# Patient Record
Sex: Female | Born: 1935 | Race: White | Hispanic: No | Smoking: Never smoker
Health system: Southern US, Community
[De-identification: ages and names within clinical notes are randomized; demographics above are authoritative.]

## PROBLEM LIST (undated history)

## (undated) DIAGNOSIS — Z87898 Personal history of other specified conditions: Secondary | ICD-10-CM

## (undated) DIAGNOSIS — I10 Essential (primary) hypertension: Secondary | ICD-10-CM

## (undated) DIAGNOSIS — M545 Low back pain, unspecified: Secondary | ICD-10-CM

## (undated) DIAGNOSIS — I2510 Atherosclerotic heart disease of native coronary artery without angina pectoris: Secondary | ICD-10-CM

## (undated) DIAGNOSIS — I679 Cerebrovascular disease, unspecified: Secondary | ICD-10-CM

## (undated) DIAGNOSIS — Z9289 Personal history of other medical treatment: Secondary | ICD-10-CM

## (undated) DIAGNOSIS — G2581 Restless legs syndrome: Secondary | ICD-10-CM

## (undated) DIAGNOSIS — G8929 Other chronic pain: Secondary | ICD-10-CM

## (undated) DIAGNOSIS — E7800 Pure hypercholesterolemia, unspecified: Secondary | ICD-10-CM

## (undated) DIAGNOSIS — H8110 Benign paroxysmal vertigo, unspecified ear: Secondary | ICD-10-CM

## (undated) DIAGNOSIS — Z8739 Personal history of other diseases of the musculoskeletal system and connective tissue: Secondary | ICD-10-CM

## (undated) DIAGNOSIS — F329 Major depressive disorder, single episode, unspecified: Secondary | ICD-10-CM

## (undated) DIAGNOSIS — M1990 Unspecified osteoarthritis, unspecified site: Secondary | ICD-10-CM

## (undated) DIAGNOSIS — J309 Allergic rhinitis, unspecified: Secondary | ICD-10-CM

## (undated) DIAGNOSIS — M4800 Spinal stenosis, site unspecified: Secondary | ICD-10-CM

## (undated) DIAGNOSIS — F32A Depression, unspecified: Secondary | ICD-10-CM

## (undated) DIAGNOSIS — K219 Gastro-esophageal reflux disease without esophagitis: Secondary | ICD-10-CM

## (undated) DIAGNOSIS — G4700 Insomnia, unspecified: Secondary | ICD-10-CM

## (undated) DIAGNOSIS — H3530 Unspecified macular degeneration: Secondary | ICD-10-CM

## (undated) HISTORY — DX: Restless legs syndrome: G25.81

## (undated) HISTORY — DX: Personal history of other specified conditions: Z87.898

## (undated) HISTORY — DX: Benign paroxysmal vertigo, unspecified ear: H81.10

## (undated) HISTORY — DX: Major depressive disorder, single episode, unspecified: F32.9

## (undated) HISTORY — DX: Unspecified macular degeneration: H35.30

## (undated) HISTORY — DX: Allergic rhinitis, unspecified: J30.9

## (undated) HISTORY — DX: Cerebrovascular disease, unspecified: I67.9

## (undated) HISTORY — PX: APPENDECTOMY: SHX54

## (undated) HISTORY — DX: Spinal stenosis, site unspecified: M48.00

## (undated) HISTORY — DX: Insomnia, unspecified: G47.00

## (undated) HISTORY — DX: Personal history of other diseases of the musculoskeletal system and connective tissue: Z87.39

## (undated) HISTORY — DX: Low back pain: M54.5

## (undated) HISTORY — DX: Low back pain, unspecified: M54.50

## (undated) HISTORY — PX: CARDIAC SURGERY: SHX584

## (undated) HISTORY — DX: Personal history of other medical treatment: Z92.89

## (undated) HISTORY — DX: Gastro-esophageal reflux disease without esophagitis: K21.9

## (undated) HISTORY — DX: Atherosclerotic heart disease of native coronary artery without angina pectoris: I25.10

## (undated) HISTORY — DX: Depression, unspecified: F32.A

## (undated) HISTORY — DX: Pure hypercholesterolemia, unspecified: E78.00

## (undated) HISTORY — DX: Other chronic pain: G89.29

## (undated) HISTORY — DX: Essential (primary) hypertension: I10

## (undated) HISTORY — DX: Unspecified osteoarthritis, unspecified site: M19.90

## (undated) HISTORY — PX: BACK SURGERY: SHX140

## (undated) HISTORY — PX: CHOLECYSTECTOMY: SHX55

---

## 1998-05-11 ENCOUNTER — Ambulatory Visit (HOSPITAL_COMMUNITY): Admission: RE | Admit: 1998-05-11 | Discharge: 1998-05-11 | Payer: Self-pay | Admitting: Specialist

## 1998-08-08 ENCOUNTER — Ambulatory Visit (HOSPITAL_COMMUNITY): Admission: RE | Admit: 1998-08-08 | Discharge: 1998-08-08 | Payer: Self-pay | Admitting: Specialist

## 1999-05-29 ENCOUNTER — Ambulatory Visit (HOSPITAL_BASED_OUTPATIENT_CLINIC_OR_DEPARTMENT_OTHER): Admission: RE | Admit: 1999-05-29 | Discharge: 1999-05-29 | Payer: Self-pay | Admitting: Orthopedic Surgery

## 1999-11-13 ENCOUNTER — Encounter (INDEPENDENT_AMBULATORY_CARE_PROVIDER_SITE_OTHER): Payer: Self-pay | Admitting: Specialist

## 1999-11-13 ENCOUNTER — Ambulatory Visit (HOSPITAL_COMMUNITY): Admission: RE | Admit: 1999-11-13 | Discharge: 1999-11-13 | Payer: Self-pay | Admitting: Ophthalmology

## 1999-11-23 ENCOUNTER — Ambulatory Visit (HOSPITAL_COMMUNITY): Admission: RE | Admit: 1999-11-23 | Discharge: 1999-11-23 | Payer: Self-pay | Admitting: Ophthalmology

## 2000-02-16 ENCOUNTER — Encounter: Admission: RE | Admit: 2000-02-16 | Discharge: 2000-02-16 | Payer: Self-pay | Admitting: Internal Medicine

## 2000-02-16 ENCOUNTER — Encounter: Payer: Self-pay | Admitting: Internal Medicine

## 2000-05-14 ENCOUNTER — Inpatient Hospital Stay (HOSPITAL_COMMUNITY): Admission: EM | Admit: 2000-05-14 | Discharge: 2000-05-17 | Payer: Self-pay

## 2000-05-14 ENCOUNTER — Encounter: Payer: Self-pay | Admitting: General Surgery

## 2000-05-14 ENCOUNTER — Encounter: Payer: Self-pay | Admitting: Emergency Medicine

## 2000-10-08 HISTORY — PX: FOOT SURGERY: SHX648

## 2002-03-10 ENCOUNTER — Encounter: Admission: RE | Admit: 2002-03-10 | Discharge: 2002-03-10 | Payer: Self-pay | Admitting: Internal Medicine

## 2002-03-10 ENCOUNTER — Encounter: Payer: Self-pay | Admitting: Internal Medicine

## 2002-08-18 ENCOUNTER — Ambulatory Visit (HOSPITAL_COMMUNITY): Admission: RE | Admit: 2002-08-18 | Discharge: 2002-08-18 | Payer: Self-pay | Admitting: Cardiology

## 2002-10-08 DIAGNOSIS — Z9289 Personal history of other medical treatment: Secondary | ICD-10-CM

## 2002-10-08 HISTORY — DX: Personal history of other medical treatment: Z92.89

## 2002-12-28 ENCOUNTER — Encounter: Payer: Self-pay | Admitting: Internal Medicine

## 2002-12-28 ENCOUNTER — Encounter: Admission: RE | Admit: 2002-12-28 | Discharge: 2002-12-28 | Payer: Self-pay | Admitting: Internal Medicine

## 2003-01-11 ENCOUNTER — Encounter: Admission: RE | Admit: 2003-01-11 | Discharge: 2003-01-11 | Payer: Self-pay | Admitting: Orthopaedic Surgery

## 2003-01-11 ENCOUNTER — Encounter: Payer: Self-pay | Admitting: Orthopaedic Surgery

## 2003-01-12 ENCOUNTER — Ambulatory Visit (HOSPITAL_BASED_OUTPATIENT_CLINIC_OR_DEPARTMENT_OTHER): Admission: RE | Admit: 2003-01-12 | Discharge: 2003-01-12 | Payer: Self-pay | Admitting: Orthopaedic Surgery

## 2003-03-13 ENCOUNTER — Encounter: Payer: Self-pay | Admitting: Internal Medicine

## 2003-03-13 ENCOUNTER — Ambulatory Visit (HOSPITAL_COMMUNITY): Admission: RE | Admit: 2003-03-13 | Discharge: 2003-03-13 | Payer: Self-pay | Admitting: Internal Medicine

## 2003-04-07 ENCOUNTER — Encounter: Admission: RE | Admit: 2003-04-07 | Discharge: 2003-04-29 | Payer: Self-pay | Admitting: Orthopaedic Surgery

## 2003-10-09 HISTORY — PX: TOTAL KNEE ARTHROPLASTY: SHX125

## 2003-10-28 ENCOUNTER — Inpatient Hospital Stay (HOSPITAL_COMMUNITY): Admission: RE | Admit: 2003-10-28 | Discharge: 2003-11-06 | Payer: Self-pay | Admitting: Orthopaedic Surgery

## 2004-01-05 ENCOUNTER — Encounter: Admission: RE | Admit: 2004-01-05 | Discharge: 2004-01-05 | Payer: Self-pay | Admitting: Internal Medicine

## 2004-01-11 ENCOUNTER — Encounter (HOSPITAL_COMMUNITY): Admission: RE | Admit: 2004-01-11 | Discharge: 2004-04-10 | Payer: Self-pay | Admitting: Internal Medicine

## 2004-05-01 ENCOUNTER — Inpatient Hospital Stay (HOSPITAL_COMMUNITY): Admission: RE | Admit: 2004-05-01 | Discharge: 2004-05-02 | Payer: Self-pay | Admitting: Neurosurgery

## 2004-07-11 ENCOUNTER — Encounter: Admission: RE | Admit: 2004-07-11 | Discharge: 2004-07-11 | Payer: Self-pay | Admitting: Internal Medicine

## 2005-02-08 ENCOUNTER — Encounter: Admission: RE | Admit: 2005-02-08 | Discharge: 2005-02-08 | Payer: Self-pay | Admitting: Internal Medicine

## 2005-06-15 ENCOUNTER — Encounter (INDEPENDENT_AMBULATORY_CARE_PROVIDER_SITE_OTHER): Payer: Self-pay | Admitting: *Deleted

## 2005-06-15 ENCOUNTER — Inpatient Hospital Stay (HOSPITAL_COMMUNITY): Admission: EM | Admit: 2005-06-15 | Discharge: 2005-06-19 | Payer: Self-pay | Admitting: Emergency Medicine

## 2005-07-06 ENCOUNTER — Encounter: Admission: RE | Admit: 2005-07-06 | Discharge: 2005-07-06 | Payer: Self-pay | Admitting: Gastroenterology

## 2005-09-06 ENCOUNTER — Inpatient Hospital Stay (HOSPITAL_COMMUNITY): Admission: RE | Admit: 2005-09-06 | Discharge: 2005-09-10 | Payer: Self-pay | Admitting: Surgery

## 2005-09-06 ENCOUNTER — Encounter (INDEPENDENT_AMBULATORY_CARE_PROVIDER_SITE_OTHER): Payer: Self-pay | Admitting: Specialist

## 2006-02-11 ENCOUNTER — Encounter: Admission: RE | Admit: 2006-02-11 | Discharge: 2006-02-11 | Payer: Self-pay | Admitting: Internal Medicine

## 2006-03-07 ENCOUNTER — Encounter: Admission: RE | Admit: 2006-03-07 | Discharge: 2006-03-07 | Payer: Self-pay | Admitting: Internal Medicine

## 2006-08-07 ENCOUNTER — Emergency Department (HOSPITAL_COMMUNITY): Admission: EM | Admit: 2006-08-07 | Discharge: 2006-08-07 | Payer: Self-pay | Admitting: Emergency Medicine

## 2007-02-17 ENCOUNTER — Encounter: Admission: RE | Admit: 2007-02-17 | Discharge: 2007-02-17 | Payer: Self-pay | Admitting: Internal Medicine

## 2007-02-20 ENCOUNTER — Encounter: Admission: RE | Admit: 2007-02-20 | Discharge: 2007-02-20 | Payer: Self-pay | Admitting: Internal Medicine

## 2008-02-18 ENCOUNTER — Encounter: Admission: RE | Admit: 2008-02-18 | Discharge: 2008-02-18 | Payer: Self-pay | Admitting: Internal Medicine

## 2009-02-18 ENCOUNTER — Encounter: Admission: RE | Admit: 2009-02-18 | Discharge: 2009-02-18 | Payer: Self-pay | Admitting: Internal Medicine

## 2010-02-20 ENCOUNTER — Encounter
Admission: RE | Admit: 2010-02-20 | Discharge: 2010-02-20 | Payer: Self-pay | Source: Home / Self Care | Admitting: Internal Medicine

## 2010-10-01 ENCOUNTER — Emergency Department (HOSPITAL_BASED_OUTPATIENT_CLINIC_OR_DEPARTMENT_OTHER)
Admission: EM | Admit: 2010-10-01 | Discharge: 2010-10-02 | Disposition: A | Discharge status: Other Acute Inpatient Hospital | Payer: Self-pay | Source: Home / Self Care | Admitting: Emergency Medicine

## 2010-10-02 ENCOUNTER — Inpatient Hospital Stay (HOSPITAL_COMMUNITY)
Admission: EM | Admit: 2010-10-02 | Discharge: 2010-10-03 | Payer: Self-pay | Attending: Internal Medicine | Admitting: Internal Medicine

## 2010-10-29 ENCOUNTER — Encounter: Payer: Self-pay | Admitting: Internal Medicine

## 2010-12-18 LAB — BASIC METABOLIC PANEL
BUN: 20 mg/dL (ref 6–23)
BUN: 12 mg/dL (ref 6–23)
BUN: 16 mg/dL (ref 6–23)
CO2: 24 mEq/L (ref 19–32)
CO2: 20 mEq/L (ref 19–32)
CO2: 26 mEq/L (ref 19–32)
Calcium: 9.5 mg/dL (ref 8.4–10.5)
Calcium: 9.2 mg/dL (ref 8.4–10.5)
Chloride: 89 mEq/L — ABNORMAL LOW (ref 96–112)
Chloride: 91 mEq/L — ABNORMAL LOW (ref 96–112)
Chloride: 98 mEq/L (ref 96–112)
Creatinine, Ser: 1 mg/dL (ref 0.4–1.2)
Creatinine, Ser: 1.23 mg/dL — ABNORMAL HIGH (ref 0.4–1.2)
GFR calc Af Amer: >60 mL/min (ref >60)
GFR calc Af Amer: >60 mL/min (ref >60)
Glucose, Bld: 272 mg/dL — ABNORMAL HIGH (ref 70–99)
Potassium: 4.7 mEq/L (ref 3.5–5.1)

## 2010-12-18 LAB — DIFFERENTIAL
Basophils Absolute: 0 10*3/uL (ref 0.0–0.1)
Basophils Relative: 0 % (ref 0–1)
Basophils Relative: 0 % (ref 0–1)
Eosinophils Absolute: 0.3 10*3/uL (ref 0.0–0.7)
Eosinophils Absolute: 0 10*3/uL (ref 0.0–0.7)
Eosinophils Relative: 2 % (ref 0–5)
Eosinophils Relative: 0 % (ref 0–5)
Lymphs Abs: 0.4 10*3/uL — ABNORMAL LOW (ref 0.7–4.0)
Neutrophils Relative %: 83 % — ABNORMAL HIGH (ref 43–77)

## 2010-12-18 LAB — CBC
HCT: 31.2 % — ABNORMAL LOW (ref 36.0–46.0)
MCH: 30.3 pg (ref 26.0–34.0)
MCH: 31.5 pg (ref 26.0–34.0)
MCHC: 34.9 g/dL (ref 30.0–36.0)
MCV: 88.6 fL (ref 78.0–100.0)
MCV: 90.4 fL (ref 78.0–100.0)
MCV: 90.7 fL (ref 78.0–100.0)
Platelets: 247 10*3/uL (ref 150–400)
Platelets: 230 10*3/uL (ref 150–400)
RBC: 3.86 MIL/uL — ABNORMAL LOW (ref 3.87–5.11)
RBC: 3.33 MIL/uL — ABNORMAL LOW (ref 3.87–5.11)
RBC: 3.44 MIL/uL — ABNORMAL LOW (ref 3.87–5.11)
RDW: 12.5 % (ref 11.5–15.5)
RDW: 12.9 % (ref 11.5–15.5)
WBC: 14.7 10*3/uL — ABNORMAL HIGH (ref 4.0–10.5)

## 2010-12-18 LAB — MAGNESIUM: Magnesium: 1.6 mg/dL (ref 1.5–2.5)

## 2010-12-18 LAB — ABO/RH: ABO/RH(D): A NEG

## 2010-12-18 LAB — TYPE AND SCREEN: ABO/RH(D): A NEG

## 2010-12-18 LAB — HEMOGLOBIN AND HEMATOCRIT, BLOOD
HCT: 33.7 % — ABNORMAL LOW (ref 36.0–46.0)
Hemoglobin: 10.8 g/dL — ABNORMAL LOW (ref 12.0–15.0)
Hemoglobin: 11.4 g/dL — ABNORMAL LOW (ref 12.0–15.0)

## 2010-12-18 LAB — GLUCOSE, CAPILLARY
Glucose-Capillary: 126 mg/dL — ABNORMAL HIGH (ref 70–99)
Glucose-Capillary: 150 mg/dL — ABNORMAL HIGH (ref 70–99)
Glucose-Capillary: 157 mg/dL — ABNORMAL HIGH (ref 70–99)
Glucose-Capillary: 173 mg/dL — ABNORMAL HIGH (ref 70–99)

## 2010-12-18 LAB — POCT CARDIAC MARKERS: Myoglobin, poc: 95.5 ng/mL (ref 12–200)
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
## 2011-02-23 NOTE — Op Note (Signed)
[place]. [facility]  Patient:    Sheila [name]                         MRN: [id number] Proc. Date: 11/13/99 Adm. Date:  [id number] Attending:  Ernesto Rutherford                           Operative Report  PREOPERATIVE DIAGNOSIS: 1. Dislocated posterior chamber intraocular lens OD into the vitreous cavity -    silicone plate. 2. Aphakia.  OPERATION: 1. Posterior vitrectomy OD. 2. Removal of dislocated posterior chamber intraocular lens implanted material rom    the vitreous cavity OD. 3. Secondary posterior chamber intraocular lens implant OD with a Storz    posterior chamber intraocular lens No. [id number] Model EZE-60 power +26.0.  SURGEON:  Ernesto Rutherford, M.D.  ANESTHESIA:  Local retrobulbar with anesthesia control right eye.  INDICATIONS FOR PROCEDURE:  The patient is a 77 year old woman with aphakia on he basis of profound vision loss suddenly painlessly in the right eye secondary to  dislocation posterior chamber intraocular lens into the vitreous cavity.  This s an attempt to remove the intraocular lens to minimize retinal damage as well as to restore pseudoaphakic functioning vision with a sulcus fixated posterior chamber intraocular lens.  The patient understands the risks of anesthesia, including the rare occurrence of death,  but also to the eye, including hemorrhage, infection, scarring, need for further surgery, loss of vision, retinal detachment, no change in vision and need for another.  She gives her consent for anesthesia as well as surgery.  After appropriate signed consent was obtained, the patient was taken to the operating room.  In the operating room, the appropriate monitoring followed by ild sedation, 0.75% Marcaine delivered 5 cc retrobulbar, followed by an additional 5 cc laterally laterally in the fashion of modified Darel Hong.  The right periocular region was sterilely prepped and draped in the usual ophthalmic  fashion.  Lid speculum applied.  Conjunctiva was then incised superotemporally, inferotemporally, and superonasally.  A 4 mm infusion was secured 3.5 mm posterior to the limbus in the inferotemporal quadrant.  Placement in the vitreous cavity  verified visually.  Superior sclerotomies were then fashioned.  Wild microscope was placed in position with a Biom attachment.  Core vitrectomy was then successfully done using peripheral vitreous skirt, was then trimmed.  The intraocular lens overlying the posterior retina was identified and stood on edge by injected Provisc adjacent to the lens.  Crescent blade was then used to groove the limbal wound re. The anterior chamber was entered and deepened with Provisc followed by entering the entire length of the wound with MVR blade.  ____ forceps was then used to grasp  the intraocular lens, brought posterior to the iris plane and then with a second angled forceps through the limbal wound, the lens was grasped and removed without difficulty through the large capsular opening.  At this time, the chamber remained formed.  The infusion was turned on.  A Storz posterior chamber and lens was then entered into the sulcus and rotated into the 12 and 6 oclock meridian where the capsular support was excellent.  It was at the 9 oclock position that a posterior radial tear was noted, however, good intact anterior capsule was noted.  At this time, the limbal wound was closed with 10-0 nylon sutures in an interrupted fashion.  At this  time, the superior sclerotomies were then closed with 7-0 Vicryl sutures.  The infusion removed, similarly closed with 7-0 Vicryl suture. Conjunctiva closed with 7-0 Vicryl suture.  Subconjunctival injections of antibiotics and steroids applied.  Sterile patch and Fox shield applied to the right eye.  Intraocular pressure had been assessed and found to be adequate. Reinspection of the posterior cavity and posterior  chamber had been done and found to be normal and adequate.  Provisc had been aspirated from the posterior chamber. As said before, the superior sclerotomies were then closed with 7-0 Vicryl suture. The infusion was removed and similarly closed with 7-0 Vicryl suture. Subconjunctival injections were carried out.  The conjunctiva had been closed with 7-0 Vicryl sutures.  Steroid and antibiotic had been applied.  Sterile and Fox shield applied. The patient awakened from anesthesia and taken to the short-stay area to be discharged home as an outpatient after suffering no complications. DD:  11/13/99 TD:  11/13/99 Job: [id number]

## 2011-02-23 NOTE — Op Note (Signed)
   NAME:  Sheila [name], Sheila [name]                           ACCOUNT NO.:  [id number]   MEDICAL RECORD NO.:  [id number]                   PATIENT TYPE:  OIB   LOCATION:  NA                                   FACILITY:  [facility]   PHYSICIAN:  Francisca December, M.D.               DATE OF BIRTH:  January 27, 1925   DATE OF PROCEDURE:  08/18/2002  DATE OF DISCHARGE:                                 OPERATIVE REPORT   PROCEDURE:  Explant of loop recorder.   INDICATIONS:  The patient is [name] 77 year old woman who is now approximately  two years status post implantable loop recorder.  This was done for  unexplained syncope.  It has reached end of life and is to be removed at  this time.  The patient did not experience any further episodes of syncope.  The data was downloaded prior to removal.  It did show episodes of sinus  rhythm only, no significant bradycardia or tachycardia.   DESCRIPTION OF PROCEDURE:  The patient was brought to the cardiac  catheterization in the postabsorptive state.  The left prepectoral region  was prepped and draped in the usual sterile fashion.  Local anesthesia was  obtained with the infiltration of 1% lidocaine at the incision scar from the  previous implant.  [name] 2-3 cm incision was made, and this as carried down by  sharp dissection to the loop recorder capsule.  The capsule was incised.  The two 0 silk sutures were exposed, and these were excised.  The device was  delivered without difficulty.  The wound was then closed using 5-0 Dexon in  [name] running fashion for the subcuticular layer.  Steri-Strips and [name] sterile  dressing were applied.  The patient was transported to the recovery area in  stable condition.   The serial number of the loop recorder explanted was [id number], model  number [id number].                                               Francisca December, M.D.    JHE/MEDQ  D:  08/18/2002  T:  08/19/2002  Job:  [id number]   cc:   Lyn Records III, M.D.  [address]  [address]  [place]  [place] [address]  Fax: [phone]   Cardiac Catheterization Laboratory

## 2011-02-23 NOTE — Op Note (Signed)
Sheila [name], Sheila [name]                 ACCOUNT NO.:  [id number]   MEDICAL RECORD NO.:  [id number]          PATIENT TYPE:  OIB   LOCATION:  5731                         FACILITY:  [facility]   PHYSICIAN:  Currie Paris, M.D.DATE OF BIRTH:  03/06/1925   DATE OF PROCEDURE:  09/06/2005  DATE OF DISCHARGE:                                 OPERATIVE REPORT   OFFICE MEDICAL RECORD NUMBER:  [id number]   PREOPERATIVE DIAGNOSIS:  Chronic calculus cholecystitis.   POSTOPERATIVE DIAGNOSIS:  Chronic calculus cholecystitis.   OPERATION:  Laparoscopic cholecystectomy with operative cholangiogram.   SURGEON:  Currie Paris, M.D.   ASSISTANT:  Rose Phi. Maple Hudson, M.D.   ANESTHESIA:  General.   CLINICAL HISTORY:  This is a 77 year old lady with biliary-type symptoms and  a finding of some gallstones on ultrasound.  She elected to proceed to  laparoscopic cholecystectomy.   DESCRIPTION OF PROCEDURE:  The patient was seen  in the holding area and had  no further questions.  She confirmed the plans for the procedure.   She was taken to the operating room and after satisfactory general  endotracheal anesthesia had been obtained, the abdomen was prepped and  draped.  The time-out occurred.   0.25% plain Marcaine was used for the incisions.  The umbilical incision was  made, the fascia identified and the peritoneal cavity entered under direct  vision.  The pursestring was placed, the Hasson introduced and the abdomen  insufflated to 15.   A camera was inserted and everything appeared to be normal.  The 10/11  trocar was placed the epigastrium and two 5's laterally.   The gallbladder was retracted over the liver.  The perineum over the  triangle of Calot was opened and the cystic artery and cystic duct dissected  out and identified.  Both were clipped singly.  The cystic duct was then  opened and a Cook catheter threaded in and held with a clip.  Operative  cholangiography appeared normal.   The cystic duct catheter was removed and 3 clips placed on the stay side of  the cystic duct.  It was divided.  Two additional clips were placed on the  cystic artery and it was divided.  Another posterior branch of the cystic  artery was dissected out and clipped with 3 clips being left behind on the  stay side.   The gallbladder was then removed from below to above with the coagulation  current of the cautery.  Once it was disconnected, we made sure the bed was  dry and then it was brought out the umbilical port.  Final irrigation check  for hemostasis was made and everything still appeared to be dry.   The lateral ports were removed under direct vision.  There was no bleeding.  The umbilical port was closed with the pursestring.  The abdomen was  deflated through the epigastric port.  Skin was closed with 4-0 Monocryl  subcuticular plus Dermabond.   The patient tolerated the procedure well.  There were no operative  complications.  All counts were correct.  Currie Paris, M.D.  Electronically Signed     CJS/MEDQ  D:  09/06/2005  T:  09/07/2005  Job:  [id number]   cc:   Ladell Pier, M.D.  Fax: [phone]   Tasia Catchings, M.D.  Fax: [phone]

## 2011-02-23 NOTE — Op Note (Signed)
NAME:  Sheila [name], Sheila [name]                           ACCOUNT NO.:  [id number]   MEDICAL RECORD NO.:  [id number]                   PATIENT TYPE:  AMB   LOCATION:  DSC                                  FACILITY:  [facility]   PHYSICIAN:  Lubertha Basque. Jerl Santos, M.D.             DATE OF BIRTH:  10-Aug-1925   DATE OF PROCEDURE:  01/12/2003  DATE OF DISCHARGE:                                 OPERATIVE REPORT   PREOPERATIVE DIAGNOSES:  1. Left knee torn medial meniscus.  2. Left knee degenerative joint disease.   POSTOPERATIVE DIAGNOSES:  1. Left knee torn medial meniscus.  2. Left knee degenerative joint disease.   OPERATION PERFORMED:  1. Left knee partial medial meniscectomy.  2. Left knee chondroplasty patellofemoral and medial compartments.   SURGEON:  Lubertha Basque. Jerl Santos, M.D.   ASSISTANT:  Prince Rome, P.[name].   ANESTHESIA:  General.   INDICATIONS FOR PROCEDURE:  The patient is [name] 77 year old woman with [name] very  long history of left knee pain, swelling and catching.  She has undergone  preoperative testing including x-rays and an MRI scan which does show some  torn cartilage.  Her pain has persisted despite injections and activity  restriction.  She has been left with pain at rest and pain with activity and  is offered an arthroscopy.  The procedure was discussed with the patient and informed operative consent  was obtained after discussion of possible complications of reaction to  anesthesia and infection.   DESCRIPTION OF PROCEDURE:  The patient was taken to the operating suite  where general anesthetic was applied without difficulty.  The patient was  positioned supine and prepped and draped in the normal sterile fashion.  After administration of preop intravenous antibiotics, an arthroscopy of the  left knee was performed through two inferior portals.  The suprapatellar  pouch was benign while the patellofemoral joint exhibited grade 3 change  across the undersurface of the  patella.  [name] thorough chondroplasty was done.  Medial compartment exhibited grade 3 and grade 4 change in the medial  femoral condyle and degeneration of the middle and posterior horns of the  medial meniscus.  [name] 10% partial medial meniscectomy was performed.  The ACL  and the PCL were intact.  The lateral compartment was completely benign.  The knee was thoroughly irrigated at the end of the case followed by  placement of Marcaine with epinephrine and morphine, plus Depo-Medrol.  Adaptic was placed over the portals followed by dry gauze and [name] loose Ace  wrap.  Estimated blood loss and intraoperative fluids as well as accurate  tourniquet time can be obtained from anesthesia records.    DISPOSITION:  The patient was extubated in the operating room and taken to  the recovery room in stable condition.  Plans were for the patient  to go  home the same day and to follow up in the office  in less than [name] week.  I  will contact her by phone tonight.                                                 Lubertha Basque Jerl Santos, M.D.    PGD/MEDQ  D:  01/12/2003  T:  01/13/2003  Job:  [id number]

## 2011-02-23 NOTE — Discharge Summary (Signed)
NAME:  Sheila [name], [name]                           ACCOUNT NO.:  [id number]   MEDICAL RECORD NO.:  [id number]                   PATIENT TYPE:  INP   LOCATION:  5023                                 FACILITY:  [facility]   PHYSICIAN:  Lubertha Basque. Jerl Santos, M.D.             DATE OF BIRTH:  08/26/1925   DATE OF ADMISSION:  10/28/2003  DATE OF DISCHARGE:  11/06/2003                                 DISCHARGE SUMMARY   ADMISSION DIAGNOSES:  1. End-stage degenerative joint disease, left knee.  2. Status post coronary artery bypass graft.  3. Hypertension.   DISCHARGE DIAGNOSES:  1. End-stage degenerative joint disease, left knee.  2. Status post coronary artery bypass graft.  3. Hypertension.   OPERATIONS:  Left total knee replacement.   BRIEF HISTORY:  Ms. Sheila [name] is a 77 year old white female patient, well known  to our practice, who is now having left knee pain with every step, trouble  sleeping at nighttime and trouble ambulating.  She, on x-ray, has end-stage  bone on bone degenerative joint disease of her left knee.  We had done an  arthroscopy of her left knee in 2004 and noted these degenerative changes  and she never really recuperated well, always having difficulty and we  discussed treatment options with the patient and family and decided to  proceed with total knee replacement.   PERTINENT LABORATORY AND X-RAY FINDINGS:  There was a KUB of the abdomen  taken during the hospitalization because of consultation and this revealed  on last testing hemoglobin was 10.6.  She had a drop in her hemoglobin  during her hospital stay and blood was replaced as necessary.  Hematocrit of  30.0, INR 2.4 last testing.  Other blood indices:  INR was 2.6 last testing.  Sodium 132, potassium 3.9, chloride 94, glucose 116, noted to be AA negative  blood type.   HOSPITAL COURSE:  The patient was admitted postoperatively, placed on p.o.  and IM analgesics for pain.  Her postoperative orders included  physical  therapy, to be weightbearing as tolerated, placed back on her home  medications, IV Ancef a gram q.8.h. x3 doses, PCA morphine pump was also  used and pharmacy for Lovenox and Coumadin DVT protocol.  Consultation from  the rehabilitation unit was also done.  She progressed well with therapy.  Because of her preoperative medicines, one of which was Plavix, there was  some ecchymoses around the incision and at the area of the tourniquet.  At  no time, did it appear to be infected and this was kept a close watch on and  was resolving at the time of her discharge from the hospital.  CPM was used  as well to help gain motion.  In addition, the physical therapy.  She was  eating and voiding.  Dressing was changed numerous times and wound kept a  close eye on, this to be sure of any  changes, none of which at any time  during her hospital stay appeared to be an infection.  At one point in her  hospitalization, she was offered a bed at rehabilitation and she had  declined it and was discharged home.   DISCHARGE CONDITION:  Improved.   DISCHARGE MEDICATIONS:  1. Coumadin per pharmacy protocol, 30 days.  2. Vicodin for pain.  3. Dyazide 37.5/25 one a day.  4. Toprol XL 50, one a day.  5. Klonopin 1 mg 1 q.h.s.  6. Norvasc 2.5 one daily.  7. Avapro 300 one daily.  8. Zocor 40 one daily.  9. Prozac 20 one daily.  10.      Skelaxin 400 1 p.o. q.4-6 p.r.n. pain or spasm.   DISCHARGE INSTRUCTIONS:  [facility] was ordered for home visits by  the physical therapist and nurse.  A CPM machine would also be used and pro  times drawn as she was on low dose Coumadin protocol for deep vein  thrombosis prophylaxis.  She could be weightbearing as tolerated.  Dressing  to be changed every other day.  She will return to our office in one week  and the phone number is given to her, [phone].   DISCHARGE DIET:  Unrestricted.      Lindwood Qua, P.A.                    Lubertha Basque  Jerl Santos, M.D.    MC/MEDQ  D:  11/25/2003  T:  11/26/2003  Job:  [id number]

## 2011-02-23 NOTE — Op Note (Signed)
NAME:  WRIGLEY, WINBORNE A                           ACCOUNT NO.:  [id number]   MEDICAL RECORD NO.:  [id number]                   PATIENT TYPE:  INP   LOCATION:  2899                                 FACILITY:  [facility]   PHYSICIAN:  Donalee Citrin, M.D.                     DATE OF BIRTH:  1925/01/09   DATE OF PROCEDURE:  05/01/2004  DATE OF DISCHARGE:                                 OPERATIVE REPORT   PREOPERATIVE DIAGNOSIS:  Severe spinal stenosis from facet arthropathy and a  large ruptured disk at L1-2 with left-sided L2 radiculopathy.   POSTOPERATIVE DIAGNOSIS:  Severe spinal stenosis from facet arthropathy and  a large ruptured disk at L1-2 with left-sided L2 radiculopathy.   OPERATION PERFORMED:  Decompressive laminectomy, L1-2; microscopic  diskectomy, L1-2; microscopic decompression of the left L2 nerve root.   SURGEON:  Donalee Citrin, M.D.   ASSISTANT:  Kathaleen Maser. Pool, M.D.   ANESTHESIA:  General.   INDICATIONS FOR PROCEDURE:  The patient is a very pleasant 77 year old  female who has had longstanding back and left leg pain radiating through her  hip around the groin to the front of her thigh with weakness in her  iliopsoas.  Preoperative imaging showed severe spinal stenosis with thecal  sac compression and left L1 and L2 nerve compression from a large ruptured  disk fragment.  The patient has failed all forms of conservative treatment  and had progressively worsening pain and left back and leg pain.  The  patient was recommended decompression procedure.  I extensively went over  the risks and benefits of surgery for decompressive laminectomy and  diskectomy with her.  She understands and agrees to proceed forward.   DESCRIPTION OF PROCEDURE:  The patient was brought to the operating room and  induced under general anesthesia, placed prone on the Wilson frame.  Back  was prepped in the usual fashion.  Preoperative imaging localized the L1-2  disk space.  A midline incision was made and  Bovie electrocautery was used  to take down this incision and subperiosteal dissection was carried out to  the lamina of L1 and L2 on the left side.  Then using high speed drill, the  inferior aspect of the lamina of L1 and the medial osteophyte complex was  drilled down after localization film confirmed the L1-2 disk space.  Then  using a 3 and 4 mm Kerrison punch, the remainder of the disk was drilled  down and bitten off exposing a severely hypertrophied ligament with  hourglass compression of the thecal sac.  At this point the operating  microscope was draped and brought into the field.  Under microscopic  illumination, some additional under-bites were taken from the undersurface  of the spinous process of L2 as well as the superior aspect of the L2  lamina.  Virtually all the lamina was removed and __________ was used  to  dissect hypertrophied ligament off of the thecal sac.  This was underbitten  with 2 and 3 mm Kerrison punches and then __________ was used to gently  dissect the L2 nerve off the large ruptured disk still contained in the  ligament in the lateral aspect of the interspace. This was incised with 11  blade scalpel.  This using a blunt nerve hook, several large fragments of  disk were removed from the lateral aspect of the disk space and in the  foramen.  These were all decompressed and removed with pituitary rongeurs  and blunt nerve hook.  At the end of diskectomy, there was no further  stenosis of the L1 and L2 nerve roots.  Several large fragments had been  removed from the lateral compartment as well as the medial compartment.  A  hockey stick and a coronary dilator were used to explore the thecal sac, L1  and L2 neural foramina.  All were noted to be widely patent.  The wound was  then copiously irrigated.  Meticulous hemostasis was obtained.  Gelfoam was  laid overtop the dura.  The muscle and fascia reapproximated with 0 and  interrupted Vicryl.  Then the skin was  closed with running 4-0 subcuticular  after subcutaneous tissue closed with 2-0 interrupted Vicryl and benzoin and  Steri-Strips applied.  The patient was taken to the recovery room in stable  condition.  At the end of the case all sponge, needle and instrument counts  were correct.                                               Donalee Citrin, M.D.    GC/MEDQ  D:  05/01/2004  T:  05/01/2004  Job:  [id number]
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
## 2011-02-23 NOTE — Discharge Summary (Signed)
Sheila [name], Sheila [name]                 ACCOUNT NO.:  [id number]   MEDICAL RECORD NO.:  [id number]          PATIENT TYPE:  INP   LOCATION:  4734                         FACILITY:  [facility]   PHYSICIAN:  Ladell Pier, M.D.   DATE OF BIRTH:  11/17/1924   DATE OF ADMISSION:  06/14/2005  DATE OF DISCHARGE:  06/19/2005                                 DISCHARGE SUMMARY   DISCHARGE DIAGNOSES:  1.  Right upper quadrant pain.  Abdominal ultrasound positive for      gallstones.  HIDA scan normal.  2.  Fever/bronchitis.  3.  Leukocytosis, resolved.  4.  Chronic neck pain.  5.  Chronic back pain.  She wears a TENS unit.  She sees chiropractor, Dr.      __________.  6.  Hypertension.  7.  Dyslipidemia.  8.  Carotid stenosis on Doppler ultrasound, 2001, no visit per records.      Negative MRA, 2005.  Carotid coronary arteries.  9.  Coronary artery disease, status post CABG.  Cardiolite in 2004 was      negative.  EF 68%.  Cardiologist:  Dr. Katrinka Blazing.  A 2-D echo in the      hospital this admission was normal.  Overall left ventricular systolic      function was in the lower limits of normal and his EF was 50-55%.      Cardiac enzymes negative.  10. Osteoarthritis.  11. Depression.  12. Restless leg syndrome.  13. History of chronic knee pain.  14. History of syncope with negative syncopal workup.  15. Allergic rhinitis.  16. Complex renal cyst.  She is followed by nephrology.   DISCHARGE MEDICATIONS:  1.  Lipitor 40 mg at bedtime.  2.  Micardis 80 mg daily.  3.  Aspirin 81 mg daily.  4.  Plavix 75 mg daily.  5.  Prozac 20 mg daily.  6.  Zantac 75 mg daily.  7.  Clonazepam 1 mg at bedtime.  8.  Toprol XL 50 mg daily.  9.  Triamterene/HCTZ 37.5/25 mg daily.  10. Norvasc 5 mg daily.  11. Zyrtec 10 mg daily.  12. Nexium 40 mg daily.  13. Nasacort AQ daily.   CONSULTATIONS:  None.   PROCEDURES:  None.   FOLLOW UP:  The patient is to follow up at Dr. Olena Leatherwood in one week.  The  patient was  referred to [facility].  The family would like patient to  be evaluated for cholecystectomy.   HISTORY OF PRESENT ILLNESS:  The patient is a 77 year old white female that  presented to the office with nausea, abdominal bloating, vomiting, and  question about abdominal bloating that has been going on for the past few  days.  She had a urinary tract infection.  She had presented to her  nephrologist and was prescribed nitrofurantoin for possible urinary tract  infection, and feeling poorly.  She has been coughing up yellow mucus for 1-  2 weeks.  Her urinary symptoms have resolved with the nitrofurantoin, but  she has been taking the Zyrtec with still having the cough.  She  feels some  shortness of breath.   Past medical history, family history, social history, medications,  allergies, review of systems, per admission H/P.   PHYSICAL EXAMINATION:  VITAL SIGNS:  Temperature 98.5 pulse 60, respirations  16, blood pressure 124/70, pulse oximetry 96% on room air.  HEENT:  Head is normocephalic, atraumatic.  Pupils equal, round, and  reactive to light but without erythema.  CARDIOVASCULAR:  Regular rate and rhythm.  LUNGS:  Clear bilaterally.  No wheezes, rhonchi, are real.  ABDOMEN:  Soft, nontender, nondistended.  Positive bowel sounds.  EXTREMITIES:  No edema.   HOSPITAL COURSE:  1.  NAUSEA, VOMITING, ABDOMINAL PAIN.  She was admitted.  She had abdominal      x-ray that was normal.  Chest x-ray normal.  She had a 2-D echo that was      normal.  Abdominal ultrasound negative.  Gallbladder ultrasound normal.      She was placed on Levaquin.  Her nausea and vomiting resolved, then she      developed pain in the right upper quadrant going towards her back.      Discussed with family and will refer the patient for possible      cholecystectomy, outpatient with general surgery.  2.  BRONCHITIS.  She was treated with IV Levaquin and her respiratory      symptoms improved.  She however  still does have a cough.  Will continue      her on the Zyrtec and the Nasacort AQ.  3.  GERD:  Continue her on the proton  pump inhibitor.  4.  HYPERTENSION.  Her blood pressure remained stable throughout the      hospitalization.  5.  HYPONATREMIA.  Most likely secondary to the thiazide diuretic with her      nausea and vomiting.  I will continue her on diuretics on discharge and      she will follow up in the office and will recheck her BNP at that time.   LABORATORY DATA:  BNP 358, BNP sodium 138, potassium 3.8, chloride 104, CO2  28, glucose 102, BUN 5, creatinine 0.8.  CEC 9.4, hemoglobin 10.3, platelets  264.  Cardiac enzymes negative.  Blood cultures negative x2.  LDH 173.  HIDA  scan was normal.   Chest x-ray:  Chronic interstitial changes.  A 2-D echo:  Overall left  ventricular systolic function was lower limits of normal.  EF of 50-55%.  Abdominal ultrasound:  Cholelithiasis without evidence of cholecystitis, 1.8  cm on right complex renal cyst.  We did add to the discharge diagnosis,  complex renal cyst.     Ladell Pier, M.D.  Electronically Signed    NJ/MEDQ  D:  06/19/2005  T:  06/19/2005  Job:  [id number]

## 2011-02-23 NOTE — H&P (Signed)
NAME:  Sheila [name], Sheila [name]                 ACCOUNT NO.:  [id number]   MEDICAL RECORD NO.:  [id number]          PATIENT TYPE:  INP   LOCATION:  4734                         FACILITY:  [facility]   PHYSICIAN:  Ladell Pier, M.D.   DATE OF BIRTH:  11/18/1924   DATE OF ADMISSION:  06/14/2005  DATE OF DISCHARGE:                                HISTORY & PHYSICAL   The patient is a 77 year old white female that had presented to the office  with nausea and vomiting, a question about abdominal bloating that has been  going on for the past few days.  First, she had a urinary tract infection,  presented to her nephrologist, was given a prescription for nitrofurantoin  that she took for possible urinary tract infection.  Since then, she has  been feeling poorly.  She has been coughing up yellow mucus x1 to 2 weeks.  Her urinary symptoms have resolved.  She has been taking Zyrtec but  continues to cough.  She feels some shortness of breath.   PAST MEDICAL HISTORY:  1.  Significant for chronic neck pain.  2.  Chronic back pain and she wears a TENS unit for.  She sees a      Land and she also sees Dr. Wynetta Emery in the past.  She has had lower      back surgery.  3.  Hypertension.  4.  Dyslipidemia.  5.  Carotid stenosis on Doppler ultrasound in 2001, 40-50% stenosis per      records.  Negative MRA in 2005.  6.  Coronary artery disease status post coronary artery bypass graft.      Cardiolite in 2004 was negative.  Ejection fraction of 86%.      Cardiologist is Dr. Katrinka Blazing.  7.  Osteoarthritis with degenerative changes in her fingers, distal      interphalangeal joint.  8.  Depression.  9.  Restless leg syndrome.  10. History of chronic knee pain.  11. History of syncope with negative syncopal work up.  12. Allergic rhinitis.   FAMILY HISTORY:  Father died at 25 from prostate cancer and old age.  Mother  died at 43.  She had Alzheimer's disease and pancreatic cancer.   SOCIAL HISTORY:  Her husband  just died in 2005/02/03.  She does not smoke  or drink.  Her husband had severe end-stage congestive heart failure.  He  died of a stroke.   MEDICATIONS:  1.  She takes Lipitor 40 mg at bedtime.  2.  Micardis 80 mg daily.  3.  Aspirin 81 mg daily.  4.  Plavix 75 mg daily.  5.  Prozac 20 mg daily.  6.  Zantac 75 mg daily.  7.  Clonazepam 1 mg at bedtime.  8.  Toprol XL 50 mg daily.  9.  Triamterine/HCTZ 37.5/25 mg daily.  10. Norvasc 5 mg daily.  11. Zyrtec 10 mg daily.  12. Nexium 40 mg daily.  13. Nasacort AQ daily.   ALLERGIES:  NOVOCAIN AND CONTRAST DYE, ALEVE AND CELEBREX.   REVIEW OF SYSTEMS:  As per history of  present illness.   PHYSICAL EXAMINATION:  VITAL SIGNS:  Blood pressure 120/60, weight 125,  heart rate 82, temperature in the office is 98.5.  Temperature in the  hospital 102, pulse oximetry 95% on room air.  HEENT:  Normocephalic, atraumatic.  Pupils equal, round, reactive to light.  Throat without erythema.  CARDIOVASCULAR:  A regular rate and rhythm with a 2/6 systolic murmur.  LUNGS:  Clear bilaterally.  No wheezes, rhonchi or rales.  ABDOMEN:  Soft, nontender, nondistended.  Positive bowel sounds.  EXTREMITIES:  Without edema.  NEUROLOGICAL:  Cranial nerves II-XII intact.  Strength 5/5 throughout.   LABORATORY DATA:  Urinalysis negative.  Cardiac enzymes negative.  EKG  without any significant changes.  Lipase 36, glucose 110, BUN 11, creatinine  0.8, sodium 130, potassium 3.9, chloride 90, CO2 29, calcium 9.9. BMP 262,  hemoglobin 13.2, MCV 91.   ASSESSMENT AND PLAN:  1.  Nausea and vomiting, fever, abdominal discomfort.  Will admit.  The      patient to the hospital for these symptoms for the second time.  They      were in the office, sent home and now came to the emergency room.  The      patient lives by herself.  We will admit her to the hospital, get an      abdominal ultrasound, rule out gallbladder disease and give her IV      Levaquin.  2.   Gastroesophageal reflux disease.  Continue her on her proton pump      inhibitor.  3.  Hypertension.  Will continue her home blood pressure medication except      for the triamterine/HCTZ.  4.  Hyponatremia.  Most likely secondary to blood pressure medications and      monitor.  5.  Hypokalemia.  Most likely secondary to blood pressure medications and      monitor.  Will replace potassium and monitor her BMP.      Ladell Pier, M.D.  Electronically Signed     NJ/MEDQ  D:  06/15/2005  T:  06/15/2005  Job:  [id number]
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
## 2011-02-23 NOTE — Discharge Summary (Signed)
[place]  Patient:    Sheila [name], Sheila [name]                        MRN: [id number] Adm. Date:  [id number] Disc. Date: [id number] Attending:  Trauma, Md Dictator:   Eugenia Pancoast, P.A. CC:         Elana Alm. Thurston Hole, M.D. (Please include original Summary for this patient for this admit.)             Darci Needle, M.D.  Nathen May, M.D., [facility]  Francisca December, M.D.   Discharge Summary  ADDENDUM  DATE OF BIRTH:  1925-08-03  The patient was seen by cardiology in followup during her stay, and it was decided the patient should undergo an inplantable loop recorder for long-term evaluation of her heart rhythm to rule out any arrhythmias which may be causing her syncopal episodes.  This procedure was performed by Dr. Amil Amen on May 16, 2000.  The patient tolerated the procedure well, and no intraoperative complications occurred.  Postoperatively, the patient did quite well.  On the following morning, she was doing well.  She was up and eating breakfast in the morning and tolerating diet satisfactorily.  The incision looked clean and dry.  No sign of infection was noted.  Because of this, she was subsequently prepared for discharge.  On further discussion with the patient, the patient has a left leg immobilizer secondary to her left patellar fracture.  She is advised to follow up with her regular orthopedic surgeon, Dr. Thurston Hole, within the next week for recheck of her knee.  The patient otherwise is doing well.  She was subsequently discharged to home in satisfactory and stable condition. DD:  05/17/00 TD:  05/18/00 Job: [id number]

## 2011-02-23 NOTE — Op Note (Signed)
NAME:  Sheila [name], Sheila [name]                           ACCOUNT NO.:  [id number]   MEDICAL RECORD NO.:  [id number]                   PATIENT TYPE:  INP   LOCATION:  2899                                 FACILITY:  [facility]   PHYSICIAN:  Lubertha Basque. Jerl Santos, M.D.             DATE OF BIRTH:  1925-09-07   DATE OF PROCEDURE:  10/28/2003  DATE OF DISCHARGE:                                 OPERATIVE REPORT   PREOPERATIVE DIAGNOSIS:  Left knee degenerative arthritis.   POSTOPERATIVE DIAGNOSIS:  Left knee degenerative arthritis.   PROCEDURE:  Left total knee replacement.   ANESTHESIA:  General and femoral nerve block.   SURGEON:  Lubertha Basque. Jerl Santos, M.D.   ASSISTANT:  Lindwood Qua, P.[name].   INDICATIONS FOR PROCEDURE:  The patient is [name] 77 year old woman with [name] long  history of left knee pain.  This has persisted despite oral anti-  inflammatories then injections as well as an arthroscopy at which point end  stage degenerative changes were seen.  She has persisted with night pain and  activity pain and is offered [name] knee replacement operation.  Informed  operative consent was obtained after discussing the possible complications  of reaction to anesthesia, infection, DVT, PE, and death.   DESCRIPTION OF PROCEDURE:  The patient was taken to the operating room suite  where general anesthetic was applied without difficulty.  She was also given  [name] femoral nerve block in the preanesthesia area.  She was positioned supine  and prepped and draped in normal sterile fashion.  After administration of  preop IV antibiotics, the left leg was elevated, exsanguinated, and [name]  tourniquet inflated about the thigh.  [name] longitudinal anterior incision was  made with dissection down to the extensor mechanism.  All appropriate anti-  infective measures were used including closed hooded exhaust systems for  each member of the surgical team, Betadine impregnated drape, and the  aforementioned IV antibiotic.  [name] medial  parapatellar incision was made.  The  knee cap was lifted and the knee flexed.  She had severe end stage  degenerative change medial and lateral.  The ACL and PCL were intact and  were removed.  Her bone quality was fair.  I removed some residual meniscal  tissues.  An extramedullary guide was placed on the tibia to create [name] cut  with [name] slight posterior tilt on the tibia.  The extramedullary guide was  then placed on the femur and utilizing the anterior and posterior cuts  creating [name] flexion gap of 10 mm.  An intramedullary guide was placed into  the femur and utilized to make [name] distal cut creating an equal extension gap  of 10 mm balancing the knee.  The femur sized to [name] size standard  compartment.  An appropriate guide was placed and utilized.  The tibia sized  to [name] size 3 component, the appropriate guide was placed and  utilized.  The  patella was cut down from 20 to 15 mm of thickness and sized to [name] 32 mm  component and the appropriate guide was placed and utilized.  The trial  reduction was done with all the components.  The knee easily came to full  extension.  The knee cap tracked well.  The trial components were removed.  Pulsatile lavage was used to cleanse all cut bony surfaces.  The cement was  mixed including the antibiotics and we then pressurized all bony surfaces.  The aforementioned DePuy LCS components were placed which were 3 tibia,  standard femur, 32 polyethylene patella, and 10 mm deep dish spacer.  Excess  cement was trimmed and pressure was held on components until the cement had  hardened.  The tourniquet was deflated and the small amount of bleeding was  easily controlled with Bovie cautery.  The decision was made not to place [name]  drain.  The extensor mechanism was reapproximated with #1 Vicryl in an  interrupted fashion followed by subcutaneous tissue reapproximation with 0  and 2-0 Vicryl in an interrupted fashion, followed by skin closure with  staples.  The knee  went from full extension to 130 degrees of flexion at the  end of the case.  Adaptic was applied to the wound followed by dry gauze and  [name] loose Ace wrap.  Estimated blood loss and fluids can be obtained from  anesthesia records as can an accurate tourniquet time.   DISPOSITION:  The patient was extubated in the operating room and taken to  the recovery room in stable condition.  The plans were for her to be  admitted to the orthopedic surgery service with appropriate postop care to  include perioperative antibiotics and Coumadin/Lovenox for DVT prophylaxis.                                               Lubertha Basque Jerl Santos, M.D.    PGD/MEDQ  D:  10/28/2003  T:  10/28/2003  Job:  [id number]

## 2011-02-23 NOTE — Discharge Summary (Signed)
Sheila [name], HOUSEMAN                 ACCOUNT NO.:  [id number]   MEDICAL RECORD NO.:  [id number]          PATIENT TYPE:  INP   LOCATION:  5731                         FACILITY:  [facility]   PHYSICIAN:  Currie Paris, M.D.DATE OF BIRTH:  1925/08/21   DATE OF ADMISSION:  09/06/2005  DATE OF DISCHARGE:  09/10/2005                                 DISCHARGE SUMMARY   DIAGNOSES:  1.  Chronic calculus cholecystitis.  2.  Postoperative bleed into abdominal wall.  3.  History of hypertension.  4.  History of reflux.   CLINICAL HISTORY:  Ms. Peaden is a 77 year old lady admitted electively for  cholecystectomy.  She has been having intermittent right upper quadrant pain  and was noted to have gallstones.  She had a fairly severe episode in  September, at which point she also had a urinary tract infection.  She was  admitted for elective surgery.  Of note, she is on multiple medications  including Plavix.  She was instructed to stop the Plavix preoperatively.  She had not told us she was on a baby aspirin, and did not stop that until  the day prior to surgery.   HOSPITAL COURSE:  The patient was admitted and taken to the operating room  where cholecystectomy was performed.  She tolerated that well.  Postoperatively, she developed some ecchymosis initially in her umbilical  incision, and then fairly significant bruising, what appeared to be a  developing hematoma of the right flank from her lateral trocars.  Sequential  hemoglobin showed a drop from preoperative 12 down eventually over three  days to 7.4.  It appeared, however, that she was not actively bleeding after  the first 24 hours, but most of this was thought to be re-equilibration. I  thought that the drop in hemoglobin was probably related to wound bleeding  from a trocar, not intra-abdominally, as she never had any abdominal  tenderness.  I thought this was most likely related to her aspirin effect.   She was transfused 2 units of  blood on September 09, 2005, and her hemoglobin  came up to greater than 10.  We had held her blood pressure medicines, but  was able to resume those as her blood pressure started to go back up after  her transfusions.  On September 10, 2005, she was tolerating a solid diet.  She was having essentially no abdominal pain except a little bit around her  ecchymosis and developed a fairly large ecchymosis.  She had no other  issues, and we thought she was stable enough to go home.   PLAN:  Leave her off of her Plavix for another week, but that she could  restart her aspirin in a couple of days.  She was going to restart her blood  pressure meds, but could also check with her primary doctor, Dr. Olena Leatherwood  about the blood pressure meds as well.  Careful plan is to follow her up in  the office in approximately two weeks.      Currie Paris, M.D.  Electronically Signed     CJS/MEDQ  D:  09/10/2005  T:  09/10/2005  Job:  [id number]   cc:   Ladell Pier, M.D.  Fax: [phone]   Tasia Catchings, M.D.  Fax: [phone]

## 2013-02-17 ENCOUNTER — Telehealth: Payer: Self-pay | Admitting: Neurology

## 2013-07-13 ENCOUNTER — Telehealth: Payer: Self-pay | Admitting: *Deleted

## 2013-07-13 NOTE — Telephone Encounter (Signed)
Message copied by Hermenia Fiscal on Mon Jul 13, 2013  3:44 PM ------      Message from: Seth Bake      Created: Mon Jul 13, 2013 10:48 AM       Lyndee Hensen, patients daughter, calling again.  Wants to see if they could schedule with Dr. Vickey Huger.  They are concerned because she is having right sided weakness.  Concerned she is having TIA's.  Please call.  ------

## 2013-07-13 NOTE — Telephone Encounter (Signed)
I called and spoke to daughter.  Wanting an appt with Dr. Vickey Huger.  Transfer of Dr. Sandria Manly for dizziness.  Pam, daughter , pt of Dr. Vickey Huger had asked her to see her mother.   She stated Dr. Vickey Huger ok .  Pt has had R sided weakness/ confusion.  06-03-13.  Has not seen pcp, appt set in 11-/14.  I stated that sx of this, TIZ/stroke need to go to ED.  She is aware.  Wanting appt.  Would send message.

## 2013-07-14 NOTE — Telephone Encounter (Signed)
Spoke with daughter and patient has not been seen since 2009.  I explained to her that we need a new referral from her PCP, and which doctor she is given to,depends on the neurological issue, and availability.  She can request Dr. Vickey Huger if she wishes.

## 2013-09-09 ENCOUNTER — Encounter (HOSPITAL_COMMUNITY): Payer: Self-pay | Admitting: Emergency Medicine

## 2013-09-09 ENCOUNTER — Emergency Department (INDEPENDENT_AMBULATORY_CARE_PROVIDER_SITE_OTHER): Payer: Medicare Other

## 2013-09-09 ENCOUNTER — Emergency Department (HOSPITAL_COMMUNITY)
Admission: EM | Admit: 2013-09-09 | Discharge: 2013-09-09 | Disposition: A | Discharge status: Home / Self Care | Payer: Medicare Other | Source: Home / Self Care | Attending: Family Medicine | Admitting: Family Medicine

## 2013-09-09 DIAGNOSIS — S2239XA Fracture of one rib, unspecified side, initial encounter for closed fracture: Secondary | ICD-10-CM

## 2013-09-09 DIAGNOSIS — W19XXXA Unspecified fall, initial encounter: Secondary | ICD-10-CM

## 2013-09-09 DIAGNOSIS — Y92009 Unspecified place in unspecified non-institutional (private) residence as the place of occurrence of the external cause: Secondary | ICD-10-CM

## 2013-09-09 DIAGNOSIS — S2232XA Fracture of one rib, left side, initial encounter for closed fracture: Secondary | ICD-10-CM

## 2013-09-09 MED ORDER — HYDROCODONE-ACETAMINOPHEN 5-325 MG PO TABS: 1.0000 | ORAL_TABLET | Freq: Four times a day (QID) | ORAL | Status: DC | PRN

## 2013-09-09 NOTE — ED Provider Notes (Signed)
CSN: [id number]     Arrival date & time 09/09/13  1319 History   First MD Initiated Contact with Patient 09/09/13 1508     Chief Complaint  Patient presents with  . Fall   (Consider location/radiation/quality/duration/timing/severity/associated sxs/prior Treatment) HPI  History reviewed. No pertinent past medical history. Past Surgical History  Procedure Laterality Date  . Appendectomy    . Cardiac surgery    . Foot surgery  2002  . Total knee arthroplasty  Jan 2005  . Back surgery    . Cholecystectomy     No family history on file. History  Substance Use Topics  . Smoking status: Never Smoker   . Smokeless tobacco: Not on file  . Alcohol Use: No   OB History   Grav Para Term Preterm Abortions TAB SAB Ect Mult Living                 Review of Systems  Allergies  Review of patient's allergies indicates no known allergies.  Home Medications   Current Outpatient Rx  Name  Route  Sig  Dispense  Refill  . FLUoxetine (PROZAC) 20 MG tablet   Oral   Take 20 mg by mouth daily.         Marland Kitchen losartan (COZAAR) 50 MG tablet   Oral   Take 50 mg by mouth daily.         . metoprolol tartrate (LOPRESSOR) 25 MG tablet   Oral   Take 25 mg by mouth 2 (two) times daily.         . Multiple Vitamin (MULTIVITAMIN) tablet   Oral   Take 1 tablet by mouth daily.         . simvastatin (ZOCOR) 20 MG tablet   Oral   Take 20 mg by mouth daily.         Marland Kitchen triamterene-hydrochlorothiazide (MAXZIDE-25) 37.5-25 MG per tablet   Oral   Take 1 tablet by mouth daily.         . clonazePAM (KLONOPIN) 1 MG tablet   Oral   Take 1 mg by mouth 2 (two) times daily.         Marland Kitchen dipyridamole (PERSANTINE) 75 MG tablet   Oral   Take 75 mg by mouth 4 (four) times daily.         Marland Kitchen HYDROcodone-acetaminophen (NORCO/VICODIN) 5-325 MG per tablet   Oral   Take 1 tablet by mouth every 6 (six) hours as needed for moderate pain.   20 tablet   0   . LORazepam (ATIVAN) 0.5 MG tablet    Oral   Take 0.5 mg by mouth every 8 (eight) hours.          BP 185/78  Pulse 61  Temp(Src) 99.1 F (37.3 C) (Oral)  Resp 20  SpO2 96% Physical Exam  ED Course  Procedures (including critical care time) Labs Review Labs Reviewed - No data to display Imaging Review Dg Ribs Unilateral W/chest Left  09/09/2013   CLINICAL DATA:  Fall with left upper posterior rib pain.  EXAM: LEFT RIBS AND CHEST - 3+ VIEW  COMPARISON:  Chest film 10/02/2010  FINDINGS: Frontal view of the chest and two views of left-sided ribs. The frontal view of the chest demonstrates prior median sternotomy. Bilateral glenohumeral joint osteoarthritis. Midline trachea. Moderate cardiomegaly with tortuous atherosclerotic aorta. No pleural effusion or pneumothorax. No congestive failure. Mild lower lobe predominant interstitial thickening.  Two views of left-sided ribs demonstrate radiographic marker which projects  over the 10th posterior lateral left rib. A displaced 9th posterior lateral left rib fracture is identified. Suspect a nondisplaced 10th lateral left rib fracture.  IMPRESSION: Acute left-sided rib fractures, without hemothorax or pneumothorax.  Cardiomegaly without congestive failure.   Electronically Signed   By: Jeronimo Greaves M.D.   On: 09/09/2013 17:19   Dg Hip Complete Left  09/09/2013   CLINICAL DATA:  Larey Seat today, left hip pain  EXAM: LEFT HIP - COMPLETE 2+ VIEW  COMPARISON:  None.  FINDINGS: Moderate pubic symphysis degenerative change. The pelvic bones are intact. No evidence of left femur fracture or dislocation. Mild bilateral hip joint space narrowing consistent with arthritis.  IMPRESSION: No acute abnormalities   Electronically Signed   By: Esperanza Heir M.D.   On: 09/09/2013 17:03    EKG Interpretation    Date/Time:    Ventricular Rate:    PR Interval:    QRS Duration:   QT Interval:    QTC Calculation:   R Axis:     Text Interpretation:              MDM  X-rays reviewed and report  per radiologist.     Linna Hoff, MD 09/09/13 [phone]

## 2013-09-09 NOTE — ED Provider Notes (Signed)
CSN: [id number]     Arrival date & time 09/09/13  1319 History   First MD Initiated Contact with Patient 09/09/13 1508     Chief Complaint  Patient presents with  . Fall   (Consider location/radiation/quality/duration/timing/severity/associated sxs/prior Treatment) HPI Comments: Patient presents with her daughter in law following a fall that occurred this morning. This was a mechanical fall and not a syncopal event. Patient states she was at home in the bathroom trying to get dressed and while trying to put her right leg into her pants she lost her balance and fell backwards into the shower stall. States she did not strike her head. She complains of mid to lower back pain and left lower posterior chest wall pain where her back struck or landed on the raised edge of the shower stall. Has been able to ambulate with the assistance of her walker since the fall.   Patient is a 79 y.o. female presenting with fall. The history is provided by the patient and a relative.  Fall    History reviewed. No pertinent past medical history. Past Surgical History  Procedure Laterality Date  . Appendectomy    . Cardiac surgery    . Foot surgery  2002  . Total knee arthroplasty  Jan 2005  . Back surgery    . Cholecystectomy     No family history on file. History  Substance Use Topics  . Smoking status: Never Smoker   . Smokeless tobacco: Not on file  . Alcohol Use: No   OB History   Grav Para Term Preterm Abortions TAB SAB Ect Mult Living                 Review of Systems  Constitutional: Negative.   HENT: Negative.   Eyes: Negative.   Respiratory: Negative.   Cardiovascular:       See HPI  Gastrointestinal: Negative.   Genitourinary: Negative.   Musculoskeletal: Positive for back pain.  Skin:       +areas of abrasion and ecchymosis on back as a result of fall.  Neurological: Negative.   Psychiatric/Behavioral: Negative.     Allergies  Review of patient's allergies indicates no known  allergies.  Home Medications   Current Outpatient Rx  Name  Route  Sig  Dispense  Refill  . FLUoxetine (PROZAC) 20 MG tablet   Oral   Take 20 mg by mouth daily.         Marland Kitchen losartan (COZAAR) 50 MG tablet   Oral   Take 50 mg by mouth daily.         . metoprolol tartrate (LOPRESSOR) 25 MG tablet   Oral   Take 25 mg by mouth 2 (two) times daily.         . Multiple Vitamin (MULTIVITAMIN) tablet   Oral   Take 1 tablet by mouth daily.         . simvastatin (ZOCOR) 20 MG tablet   Oral   Take 20 mg by mouth daily.         Marland Kitchen triamterene-hydrochlorothiazide (MAXZIDE-25) 37.5-25 MG per tablet   Oral   Take 1 tablet by mouth daily.         . clonazePAM (KLONOPIN) 1 MG tablet   Oral   Take 1 mg by mouth 2 (two) times daily.         Marland Kitchen dipyridamole (PERSANTINE) 75 MG tablet   Oral   Take 75 mg by mouth 4 (four) times daily.         Marland Kitchen  LORazepam (ATIVAN) 0.5 MG tablet   Oral   Take 0.5 mg by mouth every 8 (eight) hours.          BP 185/78  Pulse 61  Temp(Src) 99.1 F (37.3 C) (Oral)  Resp 20  SpO2 96% Physical Exam  Nursing note and vitals reviewed. Constitutional: She is oriented to person, place, and time. She appears well-developed and well-nourished. No distress.  HENT:  Head: Normocephalic and atraumatic.  Right Ear: External ear normal.  Left Ear: External ear normal.  Nose: Nose normal.  Mouth/Throat: Oropharynx is clear and moist.  Eyes: Conjunctivae are normal. Pupils are equal, round, and reactive to light.  Neck: Normal range of motion. Neck supple.  Cardiovascular: Normal rate and regular rhythm.   Pulmonary/Chest: Effort normal and breath sounds normal. No respiratory distress. She has no wheezes. She has no rales. She exhibits tenderness and bony tenderness. She exhibits no laceration, no crepitus and no deformity.    Abdominal: Soft. Bowel sounds are normal. She exhibits no distension. There is no tenderness. There is no guarding.    Musculoskeletal:       Arms: Neurological: She is alert and oriented to person, place, and time. Coordination normal.  Skin: Skin is warm and dry.  Psychiatric: She has a normal mood and affect. Her behavior is normal.    ED Course  Procedures (including critical care time) Labs Review Labs Reviewed - No data to display Imaging Review No results found.  EKG Interpretation    Date/Time:    Ventricular Rate:    PR Interval:    QRS Duration:   QT Interval:    QTC Calculation:   R Axis:     Text Interpretation:              MDM   Case discussed with Dr. Artis Flock at 16:09. He will assume care and follow up on radiograph results for films ordered in UC and disposition patient.    Jess Barters [place], [place] 09/09/13 1610

## 2013-09-09 NOTE — ED Notes (Signed)
Pt c/o back pain on left side that radiates down to left hip Reports she feel in the bathroom backwards and landed on her back in the shower Denies: head inj/LOC Pain increases w/deep breaths or activity Alert w/no signs of acute distress.

## 2013-09-09 NOTE — ED Provider Notes (Signed)
Medical screening examination/treatment/procedure(s) were performed by resident physician or non-physician practitioner and as supervising physician I was immediately available for consultation/collaboration.   Barkley Bruns MD.   Linna Hoff, MD 09/09/13 [id number]

## 2014-03-31 ENCOUNTER — Encounter: Payer: Self-pay | Admitting: Interventional Cardiology

## 2014-04-01 ENCOUNTER — Encounter: Payer: Self-pay | Admitting: Interventional Cardiology

## 2014-04-26 ENCOUNTER — Ambulatory Visit: Payer: Self-pay | Admitting: Interventional Cardiology

## 2014-04-27 ENCOUNTER — Ambulatory Visit: Payer: Self-pay | Admitting: Interventional Cardiology

## 2014-06-15 ENCOUNTER — Encounter: Payer: Self-pay | Admitting: Cardiology

## 2014-06-15 ENCOUNTER — Ambulatory Visit (INDEPENDENT_AMBULATORY_CARE_PROVIDER_SITE_OTHER): Payer: Medicare HMO | Admitting: Interventional Cardiology

## 2014-06-15 ENCOUNTER — Encounter: Payer: Self-pay | Admitting: Interventional Cardiology

## 2014-06-15 VITALS — BP 140/70 | HR 61 | Ht 62.0 in | Wt 122.0 lb

## 2014-06-15 DIAGNOSIS — E871 Hypo-osmolality and hyponatremia: Secondary | ICD-10-CM

## 2014-06-15 DIAGNOSIS — E7800 Pure hypercholesterolemia, unspecified: Secondary | ICD-10-CM

## 2014-06-15 DIAGNOSIS — I10 Essential (primary) hypertension: Secondary | ICD-10-CM

## 2014-06-15 DIAGNOSIS — I359 Nonrheumatic aortic valve disorder, unspecified: Secondary | ICD-10-CM

## 2014-06-15 DIAGNOSIS — I2510 Atherosclerotic heart disease of native coronary artery without angina pectoris: Secondary | ICD-10-CM

## 2014-06-15 NOTE — Patient Instructions (Signed)
Your physician recommends that you continue on your current medications as directed. Please refer to the Current Medication list given to you today.  Your physician wants you to follow-up in: 1 year with Dr. Varanasi. You will receive a reminder letter in the mail two months in advance. If you don't receive a letter, please call our office to schedule the follow-up appointment.  

## 2014-06-15 NOTE — Progress Notes (Signed)
Patient ID: Sheila [name], female   DOB: July 26, 1925, 80 y.o.   MRN: [id number]    [address] [place], [place]  [address] Phone: [phone] Fax:  [phone]  Date:  06/15/2014   ID:  Sheila, [name] 1925-05-26, MRN [id number]  PCP:  No PCP Per Patient      History of Present Illness: Sheila [name] is a 80 y.o. female with CAD. Her BP has been controlled. She has readings at home of 120-140/60. She checks a few times a week. She walks in [place] and has no chest pain. DOE with walking hills. Walks once a week. Walks a lot in her house daily. CAD/ASCVD:  c/o Dizziness while getting up from sitting position, lasting for seconds.  Denies : Chest pain.  Leg edema.  Nitroglycerin.  Orthopnea.  Palpitations.  Paroxysmal nocturnal dyspnea.  Syncope.     Wt Readings from Last 3 Encounters:  06/15/14 122 lb (55.339 kg)     Past Medical History  Diagnosis Date  . Coronary atherosclerosis of native coronary artery     s/p CABG in 1998  . Cerebrovascular disease     with history of TIAs  . Essential hypertension, benign   . Pure hypercholesterolemia   . GERD (gastroesophageal reflux disease)   . History of nuclear stress test 2004    Normal with EF 86%  . Insomnia   . Chronic low back pain     secondary to spinal stenosis  . RLS (restless legs syndrome)   . Allergic rhinitis   . History of syncope     with negatvie w/u per Dr. Avie Echevaria  . Depression   . Benign positional vertigo     May 2008. Better BP control seemed to help this  . DJD (degenerative joint disease)     knees  . Spinal stenosis     with lumbar spondylosis, Dr. Marcene Corning  . Macular degeneration     Dr. Sol Blazing  . History of neck pain     chronic, likely secondary to DJD    Current Outpatient Prescriptions  Medication Sig Dispense Refill  . acetaminophen (TYLENOL) 500 MG tablet Take 500 mg by mouth every 6 (six) hours as needed.      Marland Kitchen aspirin 81 MG tablet Take 162 mg by  mouth daily.      . Biotin 5000 MCG CAPS Take by mouth daily.      . clonazePAM (KLONOPIN) 1 MG tablet Take 1 mg by mouth at bedtime.       . clopidogrel (PLAVIX) 75 MG tablet Take 75 mg by mouth daily.       Marland Kitchen FLUoxetine (PROZAC) 20 MG tablet Take 20 mg by mouth daily.      . fluticasone (FLONASE) 50 MCG/ACT nasal spray Place 2 sprays into both nostrils 3 (three) times a week.      . loratadine (CLARITIN) 10 MG tablet Take 10 mg by mouth daily.      Marland Kitchen LORazepam (ATIVAN) 0.5 MG tablet Take 1 mg by mouth every 8 (eight) hours.       Marland Kitchen losartan (COZAAR) 50 MG tablet Take 50 mg by mouth daily.      . metoprolol tartrate (LOPRESSOR) 25 MG tablet Take 25 mg by mouth 2 (two) times daily.      . Multiple Vitamin (MULTIVITAMIN) tablet Take 1 tablet by mouth daily.      . Multiple Vitamins-Minerals (OCUVITE PRESERVISION PO) Take  by mouth 2 (two) times daily.      Marland Kitchen omeprazole (PRILOSEC OTC) 20 MG tablet Take 20 mg by mouth daily.      . simvastatin (ZOCOR) 20 MG tablet Take 20 mg by mouth daily.      Marland Kitchen triamterene-hydrochlorothiazide (MAXZIDE-25) 37.5-25 MG per tablet Take 1 tablet by mouth daily.       No current facility-administered medications for this visit.    Allergies:    Allergies  Allergen Reactions  . Albuterol Other (See Comments)    Had 3 treatments then vomitted  . Aleve [Naproxen Sodium] Swelling    Facial   . Amitriptyline Other (See Comments)    Confusion/agitation   . Caduet [Amlodipine-Atorvastatin]   . Celebrex [Celecoxib] Other (See Comments)    Syncope   . Contrast Media [Iodinated Diagnostic Agents]   . Macrobid [employer] Macro]   . Niacin And Related   . Novocain [Procaine]   . Tussionex Pennkinetic Er [Hydrocod Polst-Cpm Polst Er] Other (See Comments)    Feels bad    Social History:  The patient  reports that she has never smoked. She does not have any smokeless tobacco history on file. She reports that she does not drink alcohol or use illicit  drugs.   Family History:  The patient's family history includes Pancreatic cancer in her mother; Prostate cancer in her father.   ROS:  Please see the history of present illness.  No nausea, vomiting.  No fevers, chills.  No focal weakness.  No dysuria.    All other systems reviewed and negative.   PHYSICAL EXAM: VS:  BP 140/70  Pulse 61  Ht  (1.575 m)  Wt 122 lb (55.339 kg)  BMI 22.31 kg/m2 Well nourished, well developed, in no acute distress HEENT: normal Neck: no JVD, no carotid bruits Cardiac:  normal S1, S2; RRR; 2/6 systolic  Lungs:  clear to auscultation bilaterally, no wheezing, rhonchi or rales Abd: soft, nontender, no hepatomegaly Ext: no edema Skin: warm and dry Neuro:   no focal abnormalities noted  EKG:  NSR, FAVB,  RBBB  ASSESSMENT AND PLAN:  Coronary atherosclerosis of native coronary artery  IMAGING: EKG    Harward,Amy 04/27/2013 11:41:35 AM > Chudney Scheffler,JAY 04/27/2013 11:55:32 AM > NSR, RBBB   Notes: No angina. Walking limited to the house. She fell twice since last year.  Stressed importance of being safe and avoiding falling.    2. Essential hypertension, benign  Notes: COntrolled at home. Mild orthostatic sx have resolved. Be careful when standing. Make sure there is something to hold on to.    3. Pure hypercholesterolemia  Notes: Well controlled. LDL was 67 in 4/14. Simvastatin is down to 20 mg daily.  LDL 91 in 4/15   4. Murmur: Aortic sclerosis noted on 2009 echo.  Likely mild aortic stenosis at this point.  No sx of severe AS.    5. Edema: occasional. WOuld have her elevate legs when this is an issue.  6. Hyponatremia: May be related to HCTZ.  If this becomes worse, would have to consider stopping HCTZ.  Signed, Fredric Mare, MD, [facility] 06/15/2014 11:41 AM

## 2014-10-18 ENCOUNTER — Encounter: Payer: Self-pay | Admitting: Interventional Cardiology

## 2014-10-18 ENCOUNTER — Ambulatory Visit (INDEPENDENT_AMBULATORY_CARE_PROVIDER_SITE_OTHER): Payer: Medicare HMO | Admitting: Interventional Cardiology

## 2014-10-18 VITALS — BP 130/70 | HR 63 | Ht 62.0 in | Wt 122.0 lb

## 2014-10-18 DIAGNOSIS — I350 Nonrheumatic aortic (valve) stenosis: Secondary | ICD-10-CM

## 2014-10-18 DIAGNOSIS — I359 Nonrheumatic aortic valve disorder, unspecified: Secondary | ICD-10-CM

## 2014-10-18 NOTE — Progress Notes (Signed)
Patient ID: TYNESIA HARRAL, female   DOB: [birth year], 81 y.o.   MRN: [id number] Patient ID: ARANTXA PIERCEY, female   DOB: 12/21/1924, 81 y.o.   MRN: [id number]    [address] [place], [place]  [address] Phone: [phone] Fax:  [phone]  Date:  10/18/2014   ID:  Kinesha, Auten November 17, 1924, MRN [id number]  PCP:  No PCP Per Patient      History of Present Illness: Galadriel Shroff Huettner is a 81 y.o. female with CAD. Her BP has been controlled. She has readings at home of 120-140/60. She checks a few times a week. She walks in [place] and has no chest pain. DOE with walking hills. Walks once a week. Walks a lot in her house daily. CAD/ASCVD:  c/o Dizziness while getting up from sitting position, lasting for seconds.  Denies : Chest pain.   Nitroglycerin.  Orthopnea.  Palpitations.  Paroxysmal nocturnal dyspnea.  Syncope.    she had a fall recently. She also describes that she feels like her thinking is getting slower. She still does a lot for herself during the day. She is our primary care doctor who is concerned that she may be developing some congestive heart failure symptoms. She describes some swelling, which is worse at the end of the day.    Wt Readings from Last 3 Encounters:  10/18/14 122 lb (55.339 kg)  06/15/14 122 lb (55.339 kg)     Past Medical History  Diagnosis Date  . Coronary atherosclerosis of native coronary artery     s/p CABG in 1998  . Cerebrovascular disease     with history of TIAs  . Essential hypertension, benign   . Pure hypercholesterolemia   . GERD (gastroesophageal reflux disease)   . History of nuclear stress test 2004    Normal with EF 86%  . Insomnia   . Chronic low back pain     secondary to spinal stenosis  . RLS (restless legs syndrome)   . Allergic rhinitis   . History of syncope     with negatvie w/u per Dr. Avie Echevaria  . Depression   . Benign positional vertigo     May 2008. Better BP control seemed to help this  . DJD  (degenerative joint disease)     knees  . Spinal stenosis     with lumbar spondylosis, Dr. Marcene Corning  . Macular degeneration     Dr. Sol Blazing  . History of neck pain     chronic, likely secondary to DJD    Current Outpatient Prescriptions  Medication Sig Dispense Refill  . aspirin 81 MG tablet Take 162 mg by mouth daily.    . Biotin 5000 MCG CAPS Take by mouth daily.    . clonazePAM (KLONOPIN) 1 MG tablet Take 1 mg by mouth at bedtime.     . clopidogrel (PLAVIX) 75 MG tablet Take 75 mg by mouth daily.     Marland Kitchen FLUoxetine (PROZAC) 20 MG tablet Take 20 mg by mouth daily.    . fluticasone (FLONASE) 50 MCG/ACT nasal spray Place 2 sprays into both nostrils 3 (three) times a week.    . loratadine (CLARITIN) 10 MG tablet Take 10 mg by mouth daily.    Marland Kitchen losartan (COZAAR) 50 MG tablet Take 50 mg by mouth daily.    . metoprolol tartrate (LOPRESSOR) 25 MG tablet Take 25 mg by mouth 2 (two) times daily.    . Multiple Vitamin (  MULTIVITAMIN) tablet Take 1 tablet by mouth daily.    . Multiple Vitamins-Minerals (OCUVITE PRESERVISION PO) Take by mouth 2 (two) times daily.    Marland Kitchen omeprazole (PRILOSEC OTC) 20 MG tablet Take 20 mg by mouth daily.    . pramipexole (MIRAPEX) 0.125 MG tablet Take 0.125 mg by mouth as needed (TAKEN FRO RLS).    Marland Kitchen simvastatin (ZOCOR) 20 MG tablet Take 20 mg by mouth daily.    . traMADol (ULTRAM) 50 MG tablet Take 50 mg by mouth 2 (two) times daily. AS NEEDED FOR PAIN    . triamterene-hydrochlorothiazide (MAXZIDE-25) 37.5-25 MG per tablet Take 1 tablet by mouth daily.    Marland Kitchen acetaminophen (TYLENOL) 500 MG tablet Take 650 mg by mouth every 6 (six) hours as needed.     Marland Kitchen LORazepam (ATIVAN) 0.5 MG tablet Take 1 mg by mouth every 8 (eight) hours.      No current facility-administered medications for this visit.    Allergies:    Allergies  Allergen Reactions  . Albuterol Other (See Comments)    Had 3 treatments then vomitted  . Aleve [Naproxen Sodium] Swelling    Facial   .  Amitriptyline Other (See Comments)    Confusion/agitation   . Caduet [Amlodipine-Atorvastatin]   . Celebrex [Celecoxib] Other (See Comments)    Syncope   . Contrast Media [Iodinated Diagnostic Agents]   . Macrobid [employer] Macro]   . Niacin And Related   . Novocain [Procaine]   . Tussionex Pennkinetic Er [Hydrocod Polst-Cpm Polst Er] Other (See Comments)    Feels bad    Social History:  The patient  reports that she has never smoked. She does not have any smokeless tobacco history on file. She reports that she does not drink alcohol or use illicit drugs.   Family History:  The patient's family history includes Pancreatic cancer in her mother; Prostate cancer in her father.   ROS:  Please see the history of present illness.  No nausea, vomiting.  No fevers, chills.  No focal weakness.  No dysuria.  She reports mental slowing.   All other systems reviewed and negative.   PHYSICAL EXAM: VS:  BP 130/70 mmHg  Pulse 63  Ht  (1.575 m)  Wt 122 lb (55.339 kg)  BMI 22.31 kg/m2 Well nourished, well developed, in no acute distress HEENT: normal Neck: no JVD, no carotid bruits Cardiac:  normal S1, S2; RRR; 2/6 systolic  Lungs:  clear to auscultation bilaterally, no wheezing, rhonchi or rales Abd: soft, nontender, no hepatomegaly Ext: no edema Skin: warm and dry Neuro:   no focal abnormalities noted Psych: flat affect  EKG:  NSR, FAVB,  RBBB  ASSESSMENT AND PLAN:  Coronary atherosclerosis of native coronary artery  IMAGING: EKG    Harward,Amy 04/27/2013 11:41:35 AM > Avigdor Dollar,JAY 04/27/2013 11:55:32 AM > NSR, RBBB   Notes: No angina. Walking limited to the house.  Stressed importance of avoiding falls. In her most recent fall, she did break a couple of ribs.   2. Essential hypertension, benign  Notes: COntrolled at home. Mild orthostatic sx have resolved. Be careful when standing. Make sure there is something to hold on to.  She did fall once but it was not  related to dizziness. She got her legs tangled while putting on her pants.   3. Pure hypercholesterolemia  Notes: Well controlled. LDL was 67 in 4/14. Simvastatin is down to 20 mg daily.  LDL 91 in 4/15   4. Murmur: Aortic  sclerosis noted on 2009 echo.  Likely mild aortic stenosis at this point.  No sx of severe AS.   Given Dr. Kevan NyGates is concerned about congestive heart failure, will check echocardiogram to evaluate valvular and left ventricular function.  5. Edema: occasional. WOuld have her elevate legs when this is an issue.  6. Hyponatremia: May be related to HCTZ.  If this becomes worse, would have to consider stopping HCTZ.  Signed, Fredric MareJay S. Blaine Hari, MD, [facility] 10/18/2014 12:38 PM

## 2014-10-18 NOTE — Patient Instructions (Signed)
Your physician recommends that you continue on your current medications as directed. Please refer to the Current Medication list given to you today.  Your physician has requested that you have an echocardiogram. Echocardiography is a painless test that uses sound waves to create images of your heart. It provides your doctor with information about the size and shape of your heart and how well your heart's chambers and valves are working. This procedure takes approximately one hour. There are no restrictions for this procedure.  Your physician wants you to follow-up in: September 2016 with Dr. Eldridge DaceVaranasi. You will receive a reminder letter in the mail two months in advance. If you don't receive a letter, please call our office to schedule the follow-up appointment.

## 2014-10-28 ENCOUNTER — Ambulatory Visit (HOSPITAL_COMMUNITY): Payer: Medicare HMO | Attending: Interventional Cardiology

## 2014-10-28 DIAGNOSIS — I10 Essential (primary) hypertension: Secondary | ICD-10-CM | POA: Insufficient documentation

## 2014-10-28 DIAGNOSIS — I350 Nonrheumatic aortic (valve) stenosis: Secondary | ICD-10-CM | POA: Insufficient documentation

## 2014-10-28 DIAGNOSIS — E785 Hyperlipidemia, unspecified: Secondary | ICD-10-CM | POA: Diagnosis not present

## 2014-10-28 NOTE — Progress Notes (Signed)
2D Echo completed. 10/28/2014 

## 2014-10-29 ENCOUNTER — Other Ambulatory Visit (HOSPITAL_COMMUNITY): Payer: Medicare HMO

## 2014-11-16 ENCOUNTER — Telehealth: Payer: Self-pay | Admitting: Interventional Cardiology

## 2014-11-16 NOTE — Telephone Encounter (Signed)
New problem    Pt's daughter want to speak to a nurse concerning the finding of pt's Echo results. Please call pt.

## 2014-11-16 NOTE — Telephone Encounter (Signed)
Returned daughter's call about pt's echo results. Daughter states that her mother has fallen twice in the last two weeks.  States pt seems more disoriented, pt complains of feeling dizzy at times and that sometimes her hands and feet get cold. Daughter thought maybe something was seen on the echo that would explain these symptoms. Went over results again of the echo. Informed the daughter that I would speak to our [employer] provider for the day, Tereso NewcomerScott Weaver, PA-C, about these symptoms and call her back. Spoke with Lorin PicketScott and he suggested that pt be seen by her PCP. Called daughter back and informed her that Tereso NewcomerScott Weaver felt she should be seen by her PCP. Daughter stated that she would call PCP's office to get an appt. Daughter verbalized understanding and was in agreement with this plan.

## 2014-11-17 ENCOUNTER — Telehealth: Payer: Self-pay | Admitting: Interventional Cardiology

## 2014-11-17 NOTE — Telephone Encounter (Signed)
I spoke with the patient's daughter. She reports that the patient has fallen twice over the last couple of weeks. She fell the night before last and would not push her Life Alert button because she did not want EMS to come. Per the patient's daughter, they have re-emphasized with the patient that it is an understanding with [facility] [employer] that they are to contact her or the patient's son to check on her prior to the ambulance being called. They took the patient to Dr. Kevan NyGates yesterday to follow up on the fall. She did not break anything. They drew labs on her and her sodium level was low. They were contacted by Dr. Kevan NyGates nurse today with instructions to decrease maxide to 1/2 tablet once daily. I have advised the patient's daughter that this is appropriate. They are concerned that her thought processes have been a little scattered. I explained this may be due to low sodium. They will check her BP/ HR readings over the next week and see how her thought processes are. Reviewed with Dr. Eldridge DaceVaranasi and he is agreeable.

## 2014-11-17 NOTE — Telephone Encounter (Signed)
New Msg        Pt daughter calling and would like ask to some general questions for pt.    Please return daughter Dianne's call.

## 2014-11-17 NOTE — Addendum Note (Signed)
Addended by: Sherri RadMCGHEE, HEATHER C on: 11/17/2014 05:32 PM   Modules accepted: Medications

## 2014-11-25 ENCOUNTER — Telehealth: Payer: Self-pay

## 2014-11-25 NOTE — Telephone Encounter (Signed)
Patient died @ Home per Obituary °

## 2014-12-07 DEATH — deceased

## 2015-09-28 IMAGING — CR DG RIBS W/ CHEST 3+V*L*
3 series · 3 of 3 positions shown · non-contrast
Comparison: Chest film 10/02/2010

CLINICAL DATA: Fall with left upper posterior rib pain.

EXAM:
LEFT RIBS AND CHEST - 3+ VIEW

[view not recorded (1 of 3)]
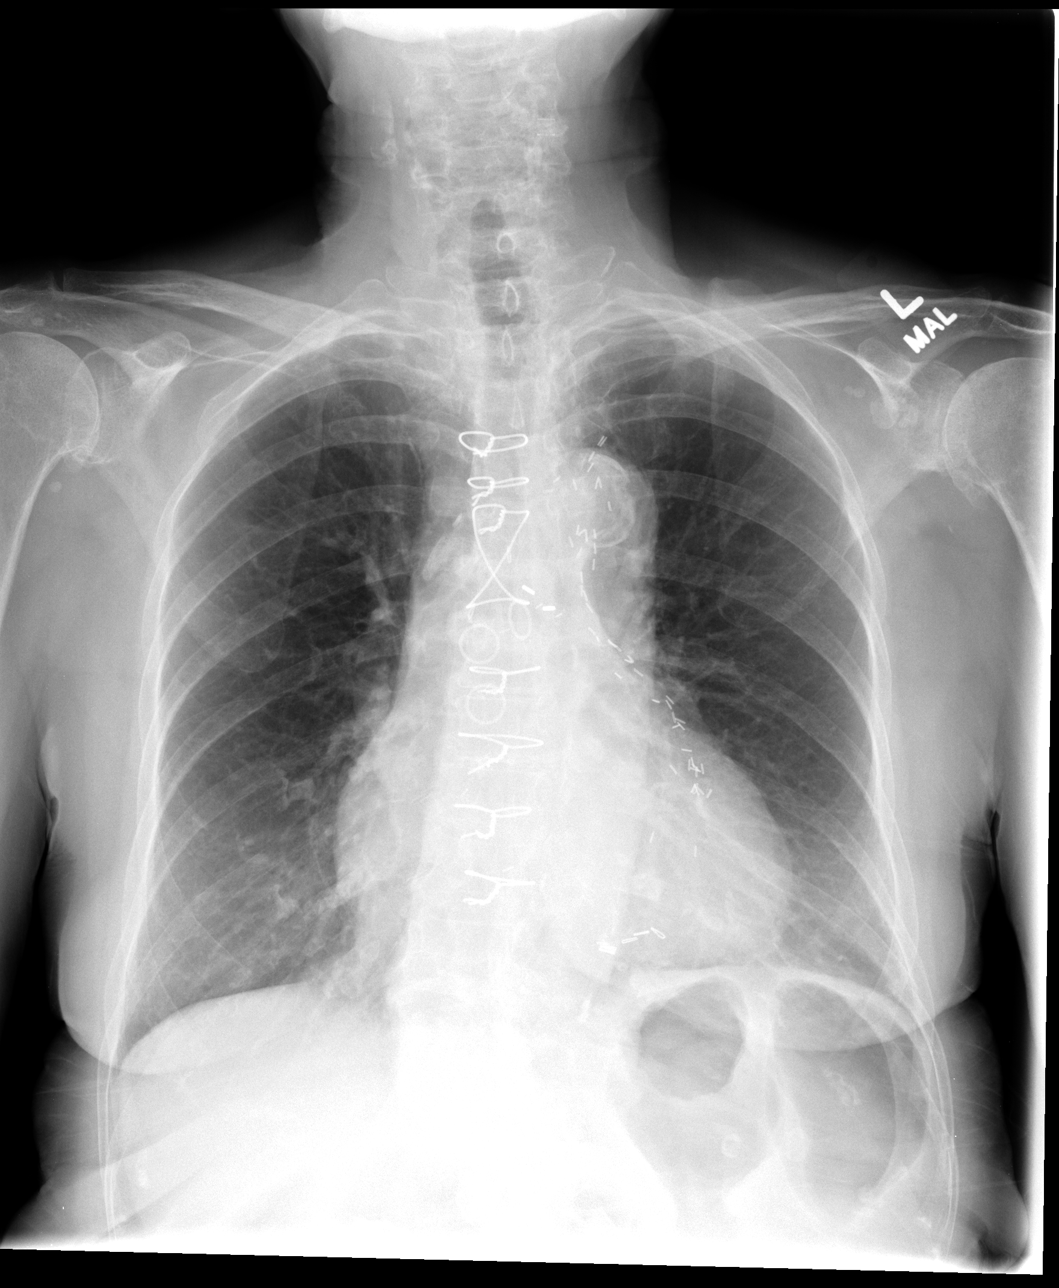

[view not recorded (2 of 3)]
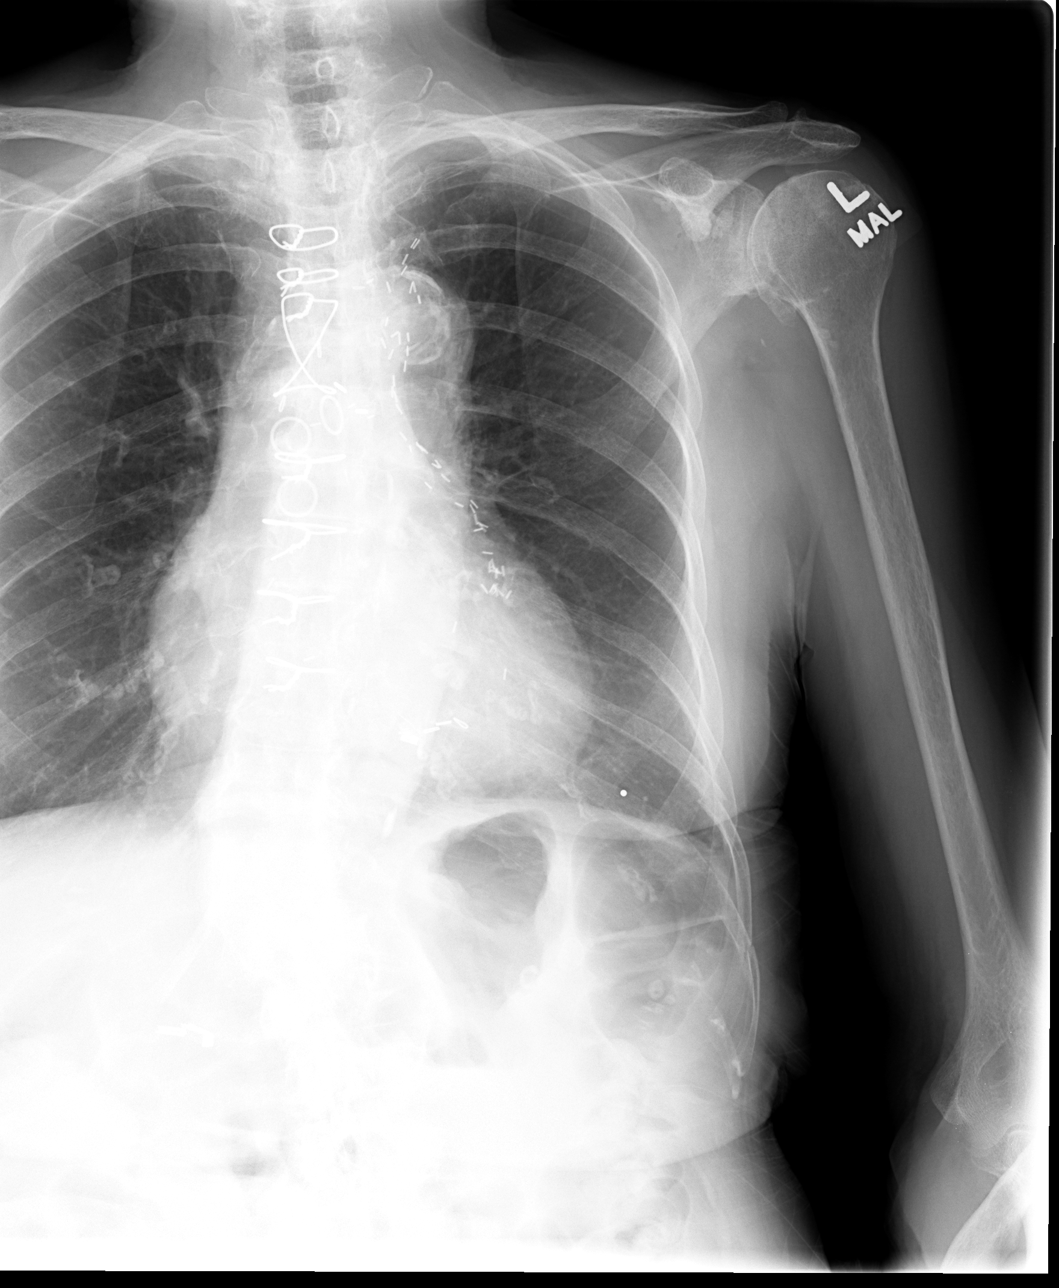

[view not recorded (3 of 3)]
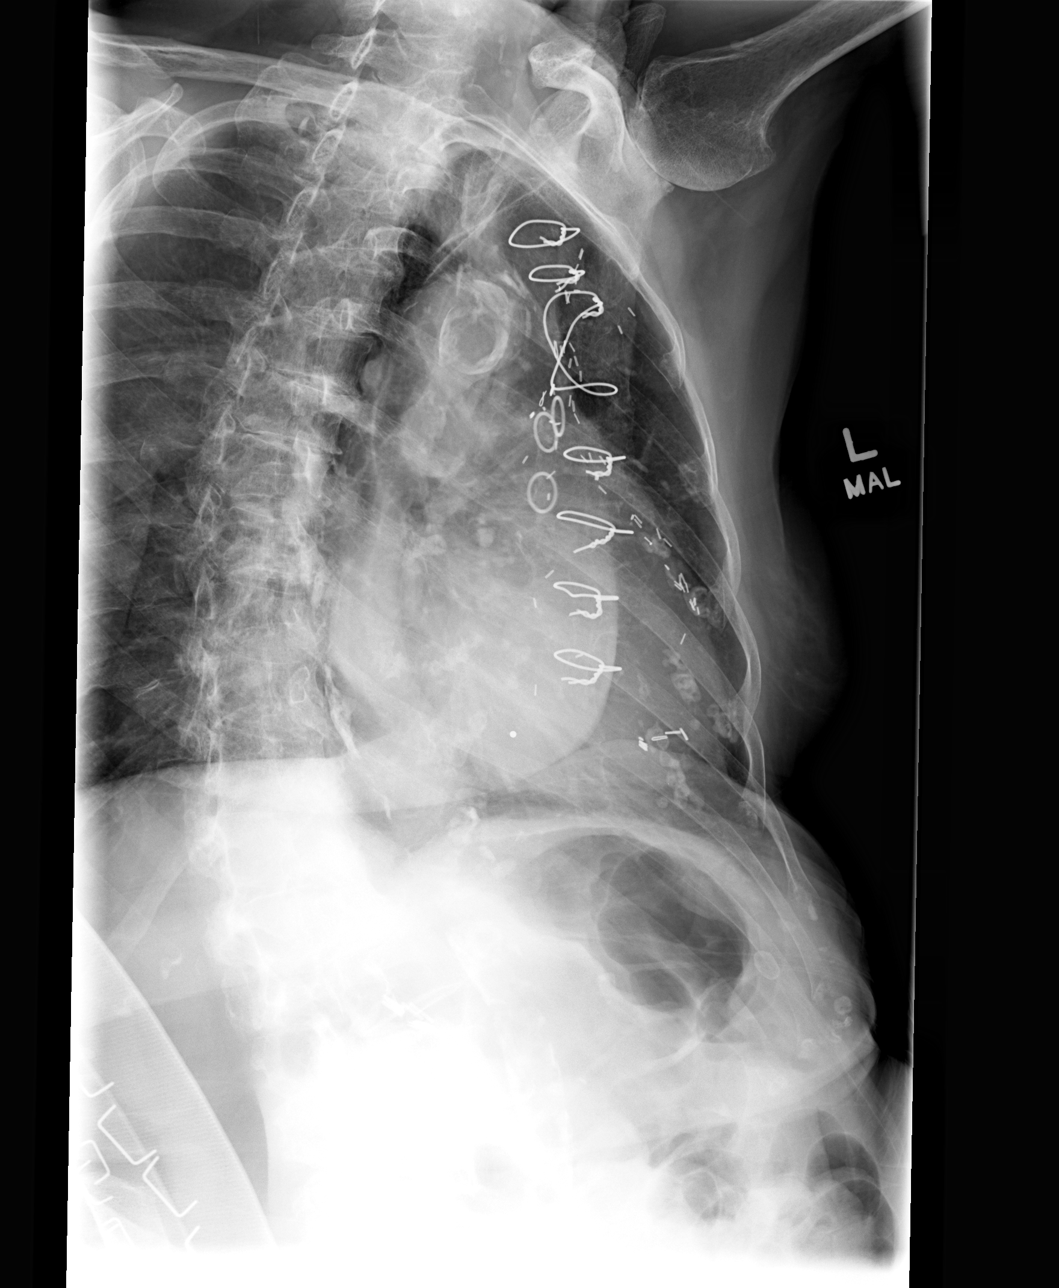

[3 of 3 positions shown; findings below may reference images not displayed]

FINDINGS: Frontal view of the chest and two views of left-sided ribs. The
frontal view of the chest demonstrates prior median sternotomy.
Bilateral glenohumeral joint osteoarthritis. Midline trachea.
Moderate cardiomegaly with tortuous atherosclerotic aorta. No
pleural effusion or pneumothorax. No congestive failure. Mild lower
lobe predominant interstitial thickening.

Two views of left-sided ribs demonstrate radiographic marker which
projects over the 10th posterior lateral left rib. A displaced 9th
posterior lateral left rib fracture is identified. Suspect a
nondisplaced 10th lateral left rib fracture.
IMPRESSION: Acute left-sided rib fractures, without hemothorax or pneumothorax.

Cardiomegaly without congestive failure.
